# Patient Record
Sex: Female | Born: 1970 | Race: Black or African American | Hispanic: No | Marital: Married | Smoking: Never smoker
Health system: Southern US, Community
[De-identification: ages and names within clinical notes are randomized; demographics above are authoritative.]

## PROBLEM LIST (undated history)

## (undated) DIAGNOSIS — D62 Acute posthemorrhagic anemia: Secondary | ICD-10-CM

## (undated) DIAGNOSIS — Z8619 Personal history of other infectious and parasitic diseases: Secondary | ICD-10-CM

## (undated) DIAGNOSIS — Z8632 Personal history of gestational diabetes: Secondary | ICD-10-CM

## (undated) DIAGNOSIS — Z8759 Personal history of other complications of pregnancy, childbirth and the puerperium: Secondary | ICD-10-CM

## (undated) DIAGNOSIS — R51 Headache: Secondary | ICD-10-CM

## (undated) DIAGNOSIS — R519 Headache, unspecified: Secondary | ICD-10-CM

## (undated) HISTORY — DX: Personal history of gestational diabetes: Z86.32

## (undated) HISTORY — DX: Personal history of other infectious and parasitic diseases: Z86.19

## (undated) HISTORY — PX: WISDOM TOOTH EXTRACTION: SHX21

---

## 1999-05-24 ENCOUNTER — Other Ambulatory Visit: Admission: RE | Admit: 1999-05-24 | Discharge: 1999-05-24 | Payer: Self-pay | Admitting: Obstetrics and Gynecology

## 1999-12-02 ENCOUNTER — Inpatient Hospital Stay (HOSPITAL_COMMUNITY): Admission: AD | Admit: 1999-12-02 | Discharge: 1999-12-05 | Payer: Self-pay | Admitting: *Deleted

## 2000-01-04 ENCOUNTER — Encounter: Admission: RE | Admit: 2000-01-04 | Discharge: 2000-04-03 | Payer: Self-pay | Admitting: Obstetrics and Gynecology

## 2000-02-24 ENCOUNTER — Other Ambulatory Visit: Admission: RE | Admit: 2000-02-24 | Discharge: 2000-02-24 | Payer: Self-pay | Admitting: Obstetrics and Gynecology

## 2000-12-04 ENCOUNTER — Other Ambulatory Visit: Admission: RE | Admit: 2000-12-04 | Discharge: 2000-12-04 | Payer: Self-pay | Admitting: Obstetrics and Gynecology

## 2001-04-27 ENCOUNTER — Observation Stay (HOSPITAL_COMMUNITY): Admission: AD | Admit: 2001-04-27 | Discharge: 2001-04-28 | Payer: Self-pay | Admitting: Obstetrics and Gynecology

## 2001-04-28 ENCOUNTER — Encounter: Payer: Self-pay | Admitting: Obstetrics and Gynecology

## 2001-05-28 ENCOUNTER — Encounter: Payer: Self-pay | Admitting: Obstetrics and Gynecology

## 2001-05-28 ENCOUNTER — Ambulatory Visit (HOSPITAL_COMMUNITY): Admission: RE | Admit: 2001-05-28 | Discharge: 2001-05-28 | Payer: Self-pay | Admitting: Obstetrics and Gynecology

## 2001-06-08 ENCOUNTER — Encounter: Admission: RE | Admit: 2001-06-08 | Discharge: 2001-07-18 | Payer: Self-pay | Admitting: Obstetrics and Gynecology

## 2001-06-23 ENCOUNTER — Inpatient Hospital Stay (HOSPITAL_COMMUNITY): Admission: AD | Admit: 2001-06-23 | Discharge: 2001-06-26 | Payer: Self-pay | Admitting: Obstetrics and Gynecology

## 2001-06-27 ENCOUNTER — Encounter: Admission: RE | Admit: 2001-06-27 | Discharge: 2001-07-18 | Payer: Self-pay | Admitting: Obstetrics and Gynecology

## 2001-07-28 ENCOUNTER — Encounter: Admission: RE | Admit: 2001-07-28 | Discharge: 2001-08-27 | Payer: Self-pay | Admitting: Obstetrics and Gynecology

## 2001-08-28 ENCOUNTER — Encounter: Admission: RE | Admit: 2001-08-28 | Discharge: 2001-09-27 | Payer: Self-pay | Admitting: Obstetrics and Gynecology

## 2001-10-10 ENCOUNTER — Other Ambulatory Visit: Admission: RE | Admit: 2001-10-10 | Discharge: 2001-10-10 | Payer: Self-pay | Admitting: Obstetrics and Gynecology

## 2001-10-28 ENCOUNTER — Encounter: Admission: RE | Admit: 2001-10-28 | Discharge: 2001-11-27 | Payer: Self-pay | Admitting: Obstetrics and Gynecology

## 2001-12-28 ENCOUNTER — Encounter: Admission: RE | Admit: 2001-12-28 | Discharge: 2002-01-27 | Payer: Self-pay | Admitting: Obstetrics and Gynecology

## 2002-01-28 ENCOUNTER — Encounter: Admission: RE | Admit: 2002-01-28 | Discharge: 2002-02-27 | Payer: Self-pay | Admitting: Obstetrics and Gynecology

## 2002-10-10 ENCOUNTER — Other Ambulatory Visit: Admission: RE | Admit: 2002-10-10 | Discharge: 2002-10-10 | Payer: Self-pay | Admitting: Obstetrics and Gynecology

## 2003-10-27 ENCOUNTER — Other Ambulatory Visit: Admission: RE | Admit: 2003-10-27 | Discharge: 2003-10-27 | Payer: Self-pay | Admitting: Obstetrics and Gynecology

## 2006-06-08 ENCOUNTER — Other Ambulatory Visit: Admission: RE | Admit: 2006-06-08 | Discharge: 2006-06-08 | Payer: Self-pay | Admitting: Obstetrics and Gynecology

## 2006-11-17 ENCOUNTER — Emergency Department (HOSPITAL_COMMUNITY): Admission: EM | Admit: 2006-11-17 | Discharge: 2006-11-17 | Payer: Self-pay | Admitting: Emergency Medicine

## 2007-08-22 ENCOUNTER — Ambulatory Visit (HOSPITAL_COMMUNITY): Admission: RE | Admit: 2007-08-22 | Discharge: 2007-08-22 | Payer: Self-pay | Admitting: Obstetrics and Gynecology

## 2007-08-22 ENCOUNTER — Inpatient Hospital Stay (HOSPITAL_COMMUNITY): Admission: AD | Admit: 2007-08-22 | Discharge: 2007-08-28 | Payer: Self-pay | Admitting: Obstetrics and Gynecology

## 2007-08-25 ENCOUNTER — Encounter (INDEPENDENT_AMBULATORY_CARE_PROVIDER_SITE_OTHER): Payer: Self-pay | Admitting: Obstetrics and Gynecology

## 2007-09-01 ENCOUNTER — Inpatient Hospital Stay (HOSPITAL_COMMUNITY): Admission: AD | Admit: 2007-09-01 | Discharge: 2007-09-04 | Payer: Self-pay | Admitting: Obstetrics and Gynecology

## 2007-09-01 ENCOUNTER — Inpatient Hospital Stay (HOSPITAL_COMMUNITY): Admission: AD | Admit: 2007-09-01 | Discharge: 2007-09-01 | Payer: Self-pay | Admitting: Obstetrics and Gynecology

## 2011-05-03 NOTE — H&P (Signed)
Shirley Griffin               ACCOUNT NO.:  [id number]   MEDICAL RECORD NO.:  [id number]          PATIENT TYPE:  INP   LOCATION:  9374                          [facility]ILITY[facility]  [facility]   PHYSICIAN:  Shirley Griffin, M.D. DATE OF BIRTH:  [birth year]   DATE OF ADMISSION:  09/01/2007  DATE OF DISCHARGE:                              HISTORY & PHYSICAL   SUBJECTIVE:  Shirley Griffin is a 40 year old black female, status post C-  section delivery of twins on August 25, 2007.  Her end of pregnancy as  well as the post partum period were complicated by elevated blood  pressures and pre-eclampsia for which she received magnesium therapy, as  well as anemia requiring 4 units of packed red blood cells.  She was  discharged home on August 28, 2007, and was given Procardia 30 mg to  take daily.  During this past week she reports slight headache at home  that is somewhat relieved by Motrin but not completely.  She has had no  visual disturbance or right upper quadrant pain.  She has reported some  burning with urination.   OBSTETRICAL HISTORY:  1. She is a gravida 4, para 4-0-1-4.  2. She had an elective AB in 1996, with no complications.  3. She had a vaginal delivery in 2000 of a female infant at 26 weeks'      gestation weighing 8 pounds with no complications.  4. She also had a C-section in 2002, of a female infant at 49 weeks'      gestation weighing 9 pounds and 14 ounce for failure to progress.   PAST MEDICAL HISTORY:  1. Gestational diabetes with her last child.  2. History of abnormal Pap in 1992, with laser therapy.  3. History of Trichomonas in the past.  4. Childhood Varicella.   PAST SURGICAL HISTORY:  1. Elective AB in 1996.  2. C-section in 2002.   FAMILY HISTORY:  Remarkable for 2 grandmothers with hypertension, mother  with insulin-dependent diabetes.   GENETIC HISTORY:  Remarkable for mother with sickle cell trait and the  patient is a twin and has a niece with twins.   SOCIAL HISTORY:  The patient is married to [employer].  He was  involved and supportive.  She works as a Designer, industrial/product.  She does not report  a religious affiliation.  She denies any alcohol, tobacco, or illicit  drug use.   OBJECTIVE DATA:  VITAL SIGNS:  Blood pressure is 151/95, 155/91, other  vital signs are stable.  She is afebrile.  HEENT:  Grossly within normal limits.  CHEST:  Clear to auscultation.  HEART:  Regular rate and rhythm.  ABDOMEN:  Soft and appropriately tender.  Her C-section incision is  healing and well approximated, although the center has an approximately  0.5-cm area with raw edges showing but without drainage or erythema.  Fundus is firm.  Lochia is scant.  She has negative CVA tenderness.  EXTREMITIES:  Without edema.   ADMISSION LABORATORY:  Hemoglobin is 11, hematocrit 32.6, white blood  cell count 13.2, platelets 430,000.  SGOT is  elevated at 81, SGP is 46,  LDH is 519.  Uric acid is 4.6.  Clean catch urinalysis specific gravity  is 1.025, 100 of glucose, 15 of ketones, large blood, 100 of protein,  trace leukocyte esterase and 7-10 white blood cells.   ASSESSMENT:  1. Status post cesarean section x1 week.  2. Pre-eclampsia.   PLAN:  1. Admit to AICU for magnesium sulfate therapy.  2. Repeat laboratories in the morning.  3. Urine culture secondary to UTI signs and symptoms.  4. M.D. to follow.      Shirley Griffin, C.N.M.      Shirley Griffin, M.D.  Electronically Signed    KS/MEDQ  D:  09/01/2007  T:  09/02/2007  Job:  [id number]

## 2011-05-03 NOTE — H&P (Signed)
NAMESPENSER, CONG NO.:  [id number]   MEDICAL RECORD NO.:  [id number]          PATIENT TYPE:  MAT   LOCATION:  MATC                          [facility]  [facility]   PHYSICIAN:  Naima A. Dillard, M.D. DATE OF BIRTH:  [birth year]   DATE OF ADMISSION:  08/22/2007  DATE OF DISCHARGE:                              HISTORY & PHYSICAL   PRIORITY ADMISSION HISTORY AND PHYSICAL   This is a 40 year old, gravida 4, para 2-0-1-2, at 68 and 4/7th weeks,  who presents for [facility] evaluation.  She has had a headache for several  days.  She denies vision changes.  She does report edema.  Pregnancy has  been followed by Dr. Pennie Rushing and remarkable for:  1)  Twins;  2)  Previous C-section;  3)  Macrosomia;  4)  History of gestational  diabetes;  5)  History of polyhydramnios;  6)  History of abnormal Pap;  7)  Desires V-BAC and BTL;  8)  ALLERGIC TO PENICILLIN AND FLOXIN.   ALLERGIES:  1. PENICILLIN.  2. FLOXIN.   OBSTETRIC HISTORY:  1. Elective abortion in 1996 with no complications.  2. She had a vaginal delivery in 2000 of a female infant at [redacted] weeks      gestation weighing 8 pounds with no complications.  3. She had a C-section in 2002 of a female infant at [redacted] weeks gestation      weighing 9 pounds 14 ounces for failure to progress.   PAST MEDICAL HISTORY:  1. Gestational diabetes with her last child.  2. History of abnormal Pap in '92 with laser therapy.  3. History of Trichomonas in the past.  4. Childhood Varicella.   PAST SURGICAL HISTORY:  1. Elective abortion in '96.  2. C-section in 2002.   FAMILY HISTORY:  Remarkable for two grandmothers with hypertension,  mother with insulin, and mother and father, who smoke cigarettes.   GENETIC HISTORY:  Remarkable for mother with sickle cell trait, and  patient, who is a twin and a niece with twins.   SOCIAL HISTORY:  The patient is married to [employer], who is  involved and supportive.  She works as a Designer, industrial/product.  She  does not report  a religious affiliation.  She denies any alcohol, tobacco, or drug use.   PRENATAL LABORATORY DATA:  Unavailable, but sickle cell trait is  negative, and gonorrhea and Chlamydia were negative.   HISTORY OF CURRENT PREGNANCY:  Patient entered care at [redacted] weeks  gestation.  She had a first trimester screen that was normal x2.  She  had some nausea at 14 weeks, and she had some second trimester bleeding  at 15 weeks related to intercourse.  Treated for a BV at 15 weeks.  She  had an ultrasound at 18 weeks that was normal except for a marginal  insertion of cord.  She had a Glucola at 20 weeks that was normal, and  ultrasound at 27 weeks that was normal except for a polyhydramnios on  Twin A.  A 27-week Glucola was normal, and she was placed on partial  bedrest at 28 weeks for increased contractions, and no further notes are  available, but she did exhibit some hypertension in the office today.   OBJECTIVE DATA:  VITAL SIGNS:  Stable.  Blood pressure is 136 to 146/77  to 86.  There was one episode of  150/104 when the patient was supine.  HEENT:  Within normal limits.  Thyroid normal and not enlarged.  CHEST:  Clear to auscultation.  HEART:  Regular rate and rhythm.  ABDOMEN:  Gravid.  Fetal monitor shows a reactive fetal heart rate in  both twins with intermittent, mild contractions.  CERVIX EXAM:  Declined by the patient.  EXTREMITIES:  1+ edema with DTRs 2+ and no clonus.   LABORATORY DATA:  White blood cell count 9.5, platelets 300.  Chemistries normal with an AST of 31, ALT of 17, uric acid 5.7.  Urine  protein is negative.   ASSESSMENT:  1. Intrauterine pregnancy with twins at 50 and 4/7th weeks.  2. Pregnancy-induced hypertension (PIH) versus preeclampsia.   PLAN:  1. Admit to Antenatal per Dr. Normand Sloop.  2. Bedrest with bathroom privileges.  3. 24-hour urine.  4. Call for blood pressure greater than 160/105.  5. TED hose.  6. Tylenol p.r.n. for  headache.      Marie L. Williams, C.N.M.      Naima A. Normand Sloop, M.D.  Electronically Signed    MLW/MEDQ  D:  08/22/2007  T:  08/22/2007  Job:  [id number]

## 2011-05-03 NOTE — Discharge Summary (Signed)
Shirley Griffin, Shirley Griffin NO.:  [id number]   MEDICAL RECORD NO.:  [id number]          PATIENT TYPE:  INP   LOCATION:  9132                          [facility]   PHYSICIAN:  Janine Limbo, M.D.DATE OF BIRTH:  11/19/1971   DATE OF ADMISSION:  08/22/2007  DATE OF DISCHARGE:  08/28/2007                               DISCHARGE SUMMARY   ADMISSION DIAGNOSES:  1. Intrauterine twin pregnancy at 36-4/7 weeks.  2. Preeclampsia.  3. Previous cesarean section.  4. Desires vaginal birth after cesarean section and tubal.   DISCHARGE DIAGNOSES:  1. Intrauterine twin pregnancy at 37 weeks.  2. Preeclampsia.  3. Desired tubal sterilization.   PROCEDURE:  1. Repeat low transverse cesarean section.  2. Tubal sterilization.  3. Spinal anesthesia.  4. Blood transfusion x4 units packed red blood cells.   HOSPITAL COURSE:  Shirley Griffin is a 40 year old gravida 4, para 2-0-1-2  at 36-4/7 weeks who presented for preeclampsia, PIH evaluation on  August 22, 2007.  Her pressures have been in the 150s/90s-104.  Pregnancy had been remarkable for:  1. Twins.  2. Previous cesarean section.  3. History of macrosomia.  4. History of gestational diabetes.  5. History of polyhydramnios.  6. History of abnormal Pap.  7. Patient initially desiring VBAC.  8. Desired tubal sterilization.  9. Allergic to PENICILLIN and FLOXIN.   Fetal heart rates were reactive.  PIH labs were within normal limits.  She was admitted for observation.  She initially had protein negative on  a urine specimen.  A 24-hour urine was begun.  The patient was placed on  Aldomet 250 mg p.o. t.i.d. on the first day of hospitalization on  September 4.  A 24-hour urine sample was obtained, showing 448 mg of  protein in a 24-hour specimen.  Heart rates were reactive.  Fetal  positions are noted to be vertex/vertex.  Patient did elect to proceed  with cesarean section.  She was taken to the operating room on  August 25, 2007 where a repeat low transverse cesarean section was performed  with an inverted T incision on the uterus, and tubal sterilization was  accomplished under spinal anesthesia.  Twin A was a viable female by the  name of Shirley Griffin.  Apgars were 8 and 9.  Weight was 6 pounds, 6 ounces.  Twin B was a female by the name of Shirley Griffin.  Apgars were 8 and 9.  Weight  was 7 pounds, 6 ounces.  The infants were taken to the full term  nursery.  Mother was taken to [facility] for continuing magnesium sulfate  therapy.  The placenta was sent to pathology, and the tubal ligation was  also verified by segments of the tube sent to pathology.   By late in the afternoon by August 25, 2007, the patient's hemoglobin  was noted to be 6.4.  The patient was feeling lightheaded.  Her vital  signs were showing some orthostatic changes.  Her physical exam was  within normal limits.  She was transfused with 2 units of packed red  blood cells.  O2 sats  were normal.  Magnesium sulfate was continued.   By day #1 postop, the patient was continuing to feel the effects of her  anemia.  Hemoglobin at that time was still low at 8.  She did receive 2  additional units, and that did make her feel much better.  Patient was  also placed on Procardia 30 mg XL daily.  Blood pressures were in the  130s/70s-80s.  Other vital signs were stable.  Hemoglobin status post  the second transfusion was 8.4.  White blood cell count 21.9, and  platelet count was 211.  Patient remained afebrile.  Infants were doing  well.   By postop day #3, the patient was up ad lib.  She was having no  problems.  She has not had a bowel movement yet but did plan to use  Dulcolax.  Her physical exam was within normal limits.  Her orthostatic  status was stable.  Dr. Stefano Gaul saw the patient.  She was deemed to  receive full benefit of her hospital stay and was discharged home.   DISCHARGE INSTRUCTIONS:  Per [facility] handout.  PIH   precautions were also reviewed with the patient.   DISCHARGE MEDICATIONS:  1. Motrin 600 mg p.o. q.6h. p.r.n. pain.  2. Percocet 5/325 1-2 p.o. q.3-4h. p.r.n. pain.  3. Procardia 30 mg XL daily.   Discharge followup will occur with the [facility] nurse on Friday of  this week for a blood pressure check and then p.r.n. to follow that at  [facility] in six weeks.      Renaldo Reel Shirley Griffin, C.N.M.      Janine Limbo, M.D.  Electronically Signed    VLL/MEDQ  D:  08/28/2007  T:  08/28/2007  Job:  [id number]

## 2011-05-03 NOTE — Discharge Summary (Signed)
Shirley Griffin, Shirley Griffin               ACCOUNT NO.:  [id number]   MEDICAL RECORD NO.:  [id number]          PATIENT TYPE:  INP   LOCATION:  9306                          [facility]   PHYSICIAN:  Naima A. Dillard, M.D. DATE OF BIRTH:  1971-10-25   DATE OF ADMISSION:  09/01/2007  DATE OF DISCHARGE:  09/04/2007                               DISCHARGE SUMMARY   ADMISSION DIAGNOSES:  1. One week post cesarean section.  2. Preeclampsia.   DISCHARGE DIAGNOSES:  1. One week post cesarean section.  2. Preeclampsia.   HOSPITAL PROCEDURES:  Magnesium sulfate administration.   HOSPITAL COURSE:  Patient was admitted status post one week from  delivery by cesarean section of twins on August 25, 2007.  She had  some hypertension at the end of her pregnancy and was discharged home on  August 28, 2007, with Procardia.  She returned with elevated blood  pressures and abnormal liver function test and was admitted to icu for  magnesium sulfate administration.  AST was 8 and ALT was 46.  On  September 02, 2007, she was doing well but had some headaches, blood  pressures were 130's to 150's/70's to 90's, AST was 85 and ALT was 43.  On September 15, blood pressures were 140 to 160/78 to 90, AST was 101,  ALT was 47.  She had her magnesium sulfate turned off and she was  transferred to the floor.  On September 04, 2007, her blood pressures  were 120's to 140's/70's to 90's.  She had a mild headache, lungs were  clear, abdomen was soft, incision was healing.  She was deemed to have  received full benefit of her hospital stay and was discharged home.   DISCHARGE LABS:  Sodium 138, potassium 4.4, creatinine 0.56, AST 74, ALT  45, LDH 645, uric acid 4.8, hemoglobin 10.2, platelets 493,000.   DISCHARGE MEDICATIONS:  1. Labetalol 200 mg p.o. t.i.d.  2. Darvocet one p.o. q.4h. p.r.n. pain.  3. Ambien 10 mg p.o. q.h.s. p.r.n. insomnia.   DISCHARGE INSTRUCTIONS:  1. Activity as tolerated.  2. Monitor  for signs and symptoms of worsening preeclampsia.  3. Medication administration and self-care.   DISCHARGE FOLLOWUP:  She has an appointment on September 10, 2007, at  1:45 p.m. with Dr. Su Hilt for a blood pressure check and labs.   CONDITION AT DISCHARGE:  Good.      Marie L. Williams, C.N.M.      Naima A. Normand Sloop, M.D.  Electronically Signed    MLW/MEDQ  D:  09/04/2007  T:  09/04/2007  Job:  [id number]

## 2011-05-03 NOTE — Op Note (Signed)
NAMEKATERIA, CUTRONA NO.:  [id number]   MEDICAL RECORD NO.:  [id number]          PATIENT TYPE:  INP   LOCATION:  9373                          [facility]   PHYSICIAN:  Osborn Coho, M.D.   DATE OF BIRTH:  13-Feb-1971   DATE OF PROCEDURE:  08/25/2007  DATE OF DISCHARGE:                               OPERATIVE REPORT   PREOPERATIVE DIAGNOSIS:  1. 37 weeks.  2. Twins.  3. Pre-eclampsia.   POSTOPERATIVE DIAGNOSIS:  1. 37 weeks.  2. Twins.  3. Pre-eclampsia.   PROCEDURE:  Repeat low transverse C-section and bilateral tubal  ligation.   ANESTHESIA:  Spinal.   ATTENDING:  Osborn Coho, M.D.   ASSISTANT:  Maxie Better, M.D.   FLUIDS REPLACED:  3200 mL.   URINE OUTPUT:  500 mL.   ESTIMATED BLOOD LOSS:  1000 mL.   COMPLICATIONS:  None.   FINDINGS:  Live female infant with Apgars of 8 at 1 minute and 9 at 5  minutes, Kaleb live female, Apgars 8 at 1 minute, 9 at 5 minutes,  placenta to pathology.   DESCRIPTION OF PROCEDURE:  The patient is taken to the operating room  after risks, benefits and alternatives were reviewed with the patient.  The patient verbalized understanding and consent signed and witnessed.  The patient was given a spinal per anesthesia and prepped and draped in  the normal sterile fashion.  A Pfannenstiel skin incision was made at  the site of the prior scar and carried down to the underlying layer of  fascia with the scalpel and Bovie.  The fascia was excised bilaterally  in the midline with the Bovie and extended bilaterally with the Mayo  scissors.  Kocher clamps were placed on the inferior aspect of fascial  incision and the rectus muscle excised from the fascia.  The same was  done on the superior aspect of the fascial incision.  The muscle was  separated in midline with a hemostat and the peritoneum entered bluntly  and extended manually.  In the lower portion of the pelvis, the rectus  muscle was excised with the  Bovie in order to excise in the midline  where the scar tissue was connecting the rectus muscles.   The bladder blade was placed and the bladder flap created with the  Metzenbaum scissors.  The uterine incision was made with a scalpel and  extended bilaterally with the bandage scissors.  The right shoulder  presented first of baby A and it was difficult to either get the head or  the foot, therefore, the incision was made to do an inverted T incision  and the infant was delivered via breech extraction without difficulty.  The cord was clamped and cut and the infant handed to the waiting  pediatricians.  Clear fluid was noted.  The membranes of baby B was then  ruptured and delivered via breech extraction with a nuchal cord x1  noted.  The infant was delivered without difficulty and after the cord  was clamped and cut, was handed off to the waiting pediatricians.  The  placentae were  removed via fundal massage and manual extraction and sent  to pathology.   The uterus was cleared of all clots and debris.  The inverted T portion  of the incision was repaired with 0 Vicryl in a running fashion and a  second imbricating layer was performed.  The primary incision was then  repaired with 0 Vicryl in a running fashion and a second imbricating  layer was performed.  The inverted T incision portion extended  approximately 4-5 cm.  After repair of the primary uterine incision.  The serosa was repaired over the inverted T portion of the incision with  3-0 Vicryl in a running fashion.  The left fallopian tube and ovary were  identified and noted to be within normal limits and the fallopian tube  was grasped with the Babcock in the isthmic portion and ligated twice  with 0 plain ties.  The tube was excised and sent off to pathology.  The  remaining pedicles were cauterized with the Bovie.  The same was done on  the right and the right ovary and fallopian tubes appeared to be within  normal limits.   Both fallopian tubes were identified at their fimbriated  ends prior to ligation.   The intra-abdominal cavity was copiously irrigated and the peritoneum  was repaired with 2-0 chromic in a running fashion.  The uterine  incision was noted to be hemostatic prior to repair of the peritoneum.  The fascia was repaired with 0 Vicryl in a running fashion.  The  subcutaneous tissue was irrigated and made hemostatic with the Bovie.  2-  0 plain stitches were placed x3 on the subcutaneous tissue to  reapproximate this area.  The skin was reapproximated using 3-0 Monocryl  via a subcuticular stitch.  Sponge, lap and needle counts were correct.  A pressure dressing was applied.  The patient tolerated the procedure  well and was awaiting transfer to the recovery room in good condition.      Osborn Coho, M.D.  Electronically Signed     AR/MEDQ  D:  08/25/2007  T:  08/25/2007  Job:  [id number]

## 2011-05-06 NOTE — H&P (Signed)
[facility]  Patient:    Shirley Griffin                           MRN: [id number] Adm. Date:  [id number] Attending:  Shaune Spittle Dictator:   Mack Guise, C.N.M.                         History and Physical  HISTORY OF PRESENT ILLNESS:   Shirley Griffin is a 40 year old gravida 2, para 0-0-1-0 at 39-3/7 weeks, EDD December 06, 1999, who presents with contractions increasing n frequency and intensity; reports positive fetal movement, no bleeding, no fluid  leaking from the vagina.  Denies any headache, visual changes or epigastric pain. Pregnancy has been followed by the C.N.M. service at [facility] and is remarkable for: #1 - Family history of twins; #2 - history of laser surgery of the cervix; #3 - group B strep negative per patient.  PRENATAL LABORATORY DATA:     On May 27, 1999, hemoglobin and hematocrit 12.5 and 32.6, platelets 444,000; blood type O-positive, antibody screen negative; sickle cell trait negative; VDRL nonreactive; rubella immune; hepatitis B surface antigen negative; HIV declined; urine culture negative; Pap smear within normal limits; GC and Chlamydia negative.  On June 21, 1999, AFP/free beta hCG within normal range at 28 weeks.  On September 14, 1999, one-hour glucose challenge 85 and hemoglobin 10.5.  At 36 weeks, culture of the vaginal tract is negative for group B strep, per the patient.  OBSTETRICAL HISTORY:          Induced abortion in 1997 and present pregnancy.  MEDICAL HISTORY:              Laser surgery of the cervix; Pap smears have been  normal since that time.  Patient with history of eczema.  SURGICAL HISTORY:             Wisdom teeth in 1992.  FAMILY HISTORY:               Maternal grandmother -- chronic hypertension. Mother with varicose veins.  GENETIC HISTORY:              Patient is a fraternal twin; otherwise, there is o history of familial or genetic disorders or children that died in  infancy or that were born with birth defects.  MEDICATIONS:                  Prenatal vitamins.  ALLERGIES:                    No known drug allergies.  HABITS:                       Patient denies the use of tobacco, alcohol or illicit drugs.  SOCIAL HISTORY:               Shirley Griffin is a 40 year old African-American college-educated female.  The father of the baby is Shona Simpson.  He is a  Chartered certified accountant.  He is involved and supportive.  They are of the [facility] faith.  REVIEW OF SYSTEMS:            There are no signs or symptoms suggestive of focal or systemic disease and the patient is typical of one with a uterine pregnancy at erm in active labor.  PHYSICAL EXAMINATION:  VITAL SIGNS:  Stable.  Afebrile.  HEENT:                        Unremarkable.  LUNGS:                        Clear.  HEART:                        Regular rate and rhythm.  ABDOMEN:                      Gravid in its contour.  Uterus fundus is noted to  extend 39 cm above the level of the pubic symphysis.  Thayer Ohm maneuvers finds the infant to be in a longitudinal lie, cephalic presentation and the estimated fetal weight is 7-1/2 pounds.  Electronic fetal monitor finds the baseline of the fetal heart rate to be 130 to 140 with average long-term variability.  Reactivity is present with periodic changes.  Patient is contracting every two to three minutes, lasting 50 to 70 seconds, being of mild-to-moderate intensity.  PELVIC:                       Digital exam of the cervix finds it to be 3- to 4-cm dilated, 80% effaced with cephalic presenting part at a -1 station.  Membranes re intact.  ASSESSMENT:                   1. Intrauterine pregnancy at 39-3/7 weeks.                               2. Active labor.  PLAN:                         Admit to birthing suite per consult with Erie Noe P. Haygood, M.D.; routine C.N.M. orders; start saline lock and Stadol 2 mg IV per patient  request for IV pain medications.  Patient will be followed expectantly in anticipation of a spontaneous vaginal delivery; this has been discussed with the patient in detail in language she can understand and she has  indicated her agreement. DD:  12/03/99 TD:  12/03/99 Job: [id number]

## 2011-05-06 NOTE — H&P (Signed)
[facility] of [facility]  Patient:    Shirley Griffin, Shirley Griffin                          MRN: [id number] Adm. Date:  [id number] Attending:  Leonard Schwartz Dictator:   Vance Gather Duplantis, C.N.M.                         History and Physical  HISTORY OF PRESENT ILLNESS:   [name] is a 40 year old black female, gravida 3, para 1-0-1-1 at 39-5/7 weeks, who presents complaining of uterine contractions every three to five minutes since about 3 a.m. this morning.  She denies any leaking or vaginal bleeding.  She denies any nausea, vomiting, headaches or visual disturbances.  Her pregnancy has been followed at [facility] by the M.D. service and has been complicated by:  #1 - Gestational diabetes, diagnosed at 36 weeks; #2 - polyhydramnios; #3 - questionable LMP; #4 - history of laser surgery.  Patients group B strep is negative and the patient desires an epidural for labor.  OBSTETRICAL/GYNECOLOGICAL HISTORY:  She is a gravida 3, para 1-0-1-1 who had an elective Ab in 1997 and delivered a viable female infant vaginally who weighed 8 pounds at [redacted] weeks gestation following a 6-hour labor in December of 2000.  She had an epidural for that labor and was delivered by Miguel Dibble, CNM.  Other GYN history:  She had abnormal Pap in 1994 that was treated with laser surgery and her Paps have been normal since.  ALLERGIES:                    She has no known drug allergies.  GENERAL MEDICAL HISTORY:      She reports having the usual childhood diseases, occasional urinary tract infections and her only surgeries were for laser surgery of the cervix, wisdom teeth and elective Ab.  FAMILY HISTORY:               Significant for maternal grandmother with hypertension and mother with varicosities.  GENETIC HISTORY:              Negative.  SOCIAL HISTORY:               She is single, she is employed full-time and she is of the [employer].  She denies any illicit drug use,  alcohol or smoking with this pregnancy.  PRENATAL LABORATORY DATA:     Her blood type is O-positive.  Her antibody screen is negative.  Sickle cell trait is negative.  Syphilis is nonreactive. Rubella is immune.  Hepatitis B surface antigen is negative.  HIV is nonreactive.  GC and Chlamydia are both negative.  Pap was within normal limits.  Her one-hour glucola is 100 and her maternal serum alpha-fetoprotein was within normal range and her 36-week beta strep was negative.  PHYSICAL EXAMINATION:  VITAL SIGNS:                  Stable.  She is afebrile.  HEENT:                        Grossly within normal limits.  HEART:                        Regular rhythm and rate.  CHEST:  Clear.  BREASTS:                      Soft and nontender.  ABDOMEN:                      Gravid with uterine contractions every three to five minutes.  Her fetal heart rate is reactive and reassuring.  PELVIC:                       Her cervix is 5 to 6 cm, 100%, vertex -1 with bulging membranes.  EXTREMITIES:                  Within normal limits.  ASSESSMENT:                   1. Intrauterine pregnancy at term.                               2. Active labor.                               3. Negative group B streptococcus.                               4. Polyhydramnios.                               5. Gestational diabetes.                               6. Desires epidural for labor.  PLAN:                         Her plan is to admit to labor and delivery, to follow routine M.D. orders per Dr. Janine Limbo; an epidural is also okayed per Dr. Janine Limbo. DD:  06/23/01 TD:  06/23/01 Job: [id number]
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
## 2011-05-06 NOTE — H&P (Signed)
[facility] of [facility]  Patient:    JIMIA, GENTLES                          MRN: [id number] Adm. Date:  [id number] Attending:  Leonard Schwartz Dictator:   Vance Gather Duplantis, C.N.M.                         History and Physical  HISTORY OF PRESENT ILLNESS:  [name] is a 40 year old black female gravida 3 DD:  06/23/01 TD:  06/23/01 Job: [id number]

## 2011-05-06 NOTE — Discharge Summary (Signed)
[facility] of [facility]  Patient:    Shirley Griffin, Shirley Griffin                          MRN: [id number] Adm. Date:  [id number] Disc. Date: 06/26/01 Attending:  Leonard Schwartz Dictator:   Wynelle Bourgeois, C.N.M.                           Discharge Summary  ADMISSION DIAGNOSES:          1. Intrauterine pregnancy at term.                               2. Active labor.                               3. Negative group B strep.                               4. Polyhydramnios.                               5. Gestational diabetes.                               6. Desires epidural for labor.  DISCHARGE DIAGNOSES:          1. Status post cesarean delivery of a viable                                  female infant named Tyshawn weighing 9 pounds                                  14 ounces, Apgars 8 and 9.                               2. Breast-feeding.                               3. Macrosomia.                               4. Cephalopelvic disproportion.  PROCEDURE:                    1. Epidural anesthesia.                               2. Primary low transverse cesarean section.  HOSPITAL COURSE:              Patient is a 40 year old G3, para 1-0-1-1 at 46 5/7 weeks who presented in labor on June 23, 2001 at 5-6 cm and continued to labor after receiving an epidural for anesthesia.  She proceeded with adequate labor, had an IUPC placed to document adequacy of labor.  Pitocin was begun when labor was deemed to be less than  optimal.  Later that day cervical examination remained unchanged.  Fetal heart rate had climbed to 170-180 after Tylenol and fluids and the patient had a fever of 101.  The recommendation for cesarean section was made at that time due to failure to progress and fetal tachycardia and the patient and her spouse agreed to proceed.  She had a low transverse cesarean section with Dr. Pennie Rushing for a viable female infant weighing 9 pounds 14 ounces with Apgars 8 and  9 with no complications.  EBL was less than 750 cc.  On postoperative day #1 she was doing well.  The baby was breast-feeding without problems.  She was afebrile.  Hemoglobin was 8.9.  She proceeded to receive routine postoperative care.  On June 24, 2001 her maximum temperature was 100.6 and she had no further fever after that time.  On postoperative day #2 she remained afebrile.  Physical examination was within normal limits.  She was breast-feeding well.  Her dressing remained clean, dry, and intact and her lochia was small.  On postoperative day #3 her vital signs were stable.  Her maximum temperature was 97.9.  Lungs were clear. Heart rate was regular.  Breasts were soft and nontender and filling. Incision was clean and dry with moderate subcutaneous edema of the skin just above the incision.  Lochia was small and moderate.  Extremities were within normal limits.  She was deemed to have received the full benefit of her hospital stay and was discharged home.  DISCHARGE LABORATORIES:       WBC 16.7, hemoglobin 8.9, hematocrit 26.9, platelets 283.  RPR nonreactive.  DISCHARGE MEDICATIONS:        1. Ibuprofen 600 mg p.o. q.6h. p.r.n.                               2. Tylox one to two p.o. q.3-4h. p.r.n.                               3. Micronor one p.o. q.d.  DISCHARGE INSTRUCTIONS:       Per [facility] handout.  DISCHARGE FOLLOW-UP:          Six weeks at [facility] or p.r.n. as indicated.DD: 06/26/01 TD:  06/26/01 Job: [id number]

## 2011-05-06 NOTE — Op Note (Signed)
[facility] [place]  Patient:    Shirley Griffin, Shirley Griffin                          MRN: [id number] Proc. Date: 06/23/01 Adm. Date:  [id number] Attending:  Leonard Schwartz                           Operative Report  PREOPERATIVE DIAGNOSES:       1. Intrauterine pregnancy at term.                               2. Failure to progress in labor.                               3. Fetal tachycardia.                               4. Polyhydramnios.  POSTOPERATIVE DIAGNOSES:      1. Intrauterine pregnancy at term.                               2. Failure to progress in labor.                               3. Fetal tachycardia.                               4. Polyhydramnios.                               5. Fetal macrosomia.                               6. Cephalopelvic disproportion.  OPERATION:                    Primary low transverse cesarean section.  SURGEON:                      Vanessa P. Pennie Rushing, M.D.  FIRST ASSISTANT:              Wynelle Bourgeois, C.N.M.  ANESTHESIA:                   Epidural.  ESTIMATED BLOOD LOSS:         Less than 750 cc.  COMPLICATIONS:                None.  FINDINGS:                     The uterus, tubes and ovaries were normal for the gravid state.  The patient was delivered of a female infant whose name is Tyshawn weighing 9 lb 14 oz with Apgars of 8 and 9 at one and five minutes respectively.  DESCRIPTION OF PROCEDURE:     The patient was taken to the operating room after appropriate identification and placed on the operating table.  After achievement of surgical anesthesia with the labor epidural, she was  placed in the supine position with a left lateral tilt.  Her Foley catheter from labor area was in place.  The abdomen was prepped with multiple layers of Betadine and draped as a sterile field.  A transverse incision was made in the abdomen and the abdomen opened in layers.  The peritoneum was entered and the uterus incised  approximately 1 cm above the uterovesical fold.  The infant was delivered from the occiput transverse position with the aid of a  Mityvac vacuum extractor and, after having the nares and pharynx suctioned and the cord clamped and cut, was handed off to the awaiting pediatricians.  The appropriate cord blood was drawn and the placenta noted to have separated from the uterus, then removed with gentle traction.  The uterine incision was closed with a running interlocking suture of 0 Vicryl.  An imbricating suture of 0 Vicryl was placed to allow for adequate hemostasis.  The visceral peritoneum was closed with a figure-of-eight suture of 2-0 Vicryl.  Copious irrigation was carried out and the abdominal peritoneum closed with a running suture of 2-0 Vicryl.  The rectus muscles were reapproximated in the midline with a figure-of-eight suture of 2-0 Vicryl.  The rectus fascia was closed with a running suture of 0 Vicryl then reinforced on either side of midline with a figure-of-eight suture of 0 Vicryl.  The subcutaneous tissue was made hemostatic with Bovie cautery and irrigated.  Skin staples were applied to the skin incision.  A sterile dressing was applied and the patient taken from the operating room to the recovery room in satisfactory condition, having tolerated the procedure well with sponge and instrument counts correct.  The infant went to the full-term nursery. DD:  06/23/01 TD:  06/23/01 Job: [id number]

## 2011-09-29 LAB — CBC
HCT: 31.1 — ABNORMAL LOW
Hemoglobin: 10.2 — ABNORMAL LOW
Hemoglobin: 10.4 — ABNORMAL LOW
MCHC: 33.4
MCV: 85.5
Platelets: 484 — ABNORMAL HIGH
RBC: 3.58 — ABNORMAL LOW
RBC: 3.64 — ABNORMAL LOW
RDW: 19.9 — ABNORMAL HIGH
WBC: 10.6 — ABNORMAL HIGH

## 2011-09-29 LAB — DIFFERENTIAL
Basophils Relative: 0
Eosinophils Absolute: 0.2
Eosinophils Relative: 1
Lymphs Abs: 2.2
Monocytes Relative: 6
Neutrophils Relative %: 74

## 2011-09-29 LAB — COMPREHENSIVE METABOLIC PANEL
ALT: 45 — ABNORMAL HIGH
AST: 74 — ABNORMAL HIGH
Albumin: 2.5 — ABNORMAL LOW
Alkaline Phosphatase: 90
BUN: 5 — ABNORMAL LOW
CO2: 25
Calcium: 7.9 — ABNORMAL LOW
Calcium: 8.7
Creatinine, Ser: 0.48
GFR calc Af Amer: >60
Glucose, Bld: 79
Glucose, Bld: 93
Potassium: 4.4
Sodium: 138
Total Protein: 5.9 — ABNORMAL LOW
Total Protein: 6.4

## 2011-09-29 LAB — LACTATE DEHYDROGENASE: LDH: 679 — ABNORMAL HIGH

## 2011-09-29 LAB — URIC ACID: Uric Acid, Serum: 4.6

## 2011-09-29 LAB — MAGNESIUM
Magnesium: 2.1
Magnesium: 4.3 — ABNORMAL HIGH

## 2011-09-30 LAB — CBC
HCT: 19.9 — ABNORMAL LOW
HCT: 23.5 — ABNORMAL LOW
HCT: 29.8 — ABNORMAL LOW
HCT: 31.3 — ABNORMAL LOW
HCT: 31.7 — ABNORMAL LOW
Hemoglobin: 10.5 — ABNORMAL LOW
Hemoglobin: 10.5 — ABNORMAL LOW
Hemoglobin: 11 — ABNORMAL LOW
Hemoglobin: 6.4 — CL
Hemoglobin: 8 — ABNORMAL LOW
Hemoglobin: 8.4 — ABNORMAL LOW
Hemoglobin: 9.8 — ABNORMAL LOW
MCHC: 32.2
MCHC: 33
MCHC: 33.1
MCHC: 33.1
MCHC: 33.7
MCHC: 33.8
MCHC: 33.9
MCHC: 33.9
MCV: 78.9
MCV: 79.4
MCV: 80.6
MCV: 85
Platelets: 219
Platelets: 262
Platelets: 430 — ABNORMAL HIGH
RBC: 2.47 — ABNORMAL LOW
RBC: 2.93 — ABNORMAL LOW
RBC: 3.67 — ABNORMAL LOW
RBC: 3.78 — ABNORMAL LOW
RBC: 3.79 — ABNORMAL LOW
RBC: 3.99
RDW: 18.9 — ABNORMAL HIGH
RDW: 19 — ABNORMAL HIGH
RDW: 19.6 — ABNORMAL HIGH
RDW: 20 — ABNORMAL HIGH
RDW: 22.7 — ABNORMAL HIGH
RDW: 22.8 — ABNORMAL HIGH
RDW: 23.2 — ABNORMAL HIGH
WBC: 19.6 — ABNORMAL HIGH

## 2011-09-30 LAB — CREATININE CLEARANCE, URINE, 24 HOUR
Creatinine Clearance: 164 — ABNORMAL HIGH
Creatinine, 24H Ur: 1369
Creatinine: 0.58
Urine Total Volume-CRCL: 4075

## 2011-09-30 LAB — COMPREHENSIVE METABOLIC PANEL
ALT: 12
ALT: 12
ALT: 14
ALT: 17
ALT: 19
ALT: 43 — ABNORMAL HIGH
ALT: 46 — ABNORMAL HIGH
AST: 27
AST: 28
AST: 32
AST: 81 — ABNORMAL HIGH
Albumin: 1.4 — ABNORMAL LOW
Albumin: 2.6 — ABNORMAL LOW
Alkaline Phosphatase: 101
Alkaline Phosphatase: 107
Alkaline Phosphatase: 170 — ABNORMAL HIGH
Alkaline Phosphatase: 97
Alkaline Phosphatase: 99
BUN: 2 — ABNORMAL LOW
BUN: 3 — ABNORMAL LOW
BUN: 4 — ABNORMAL LOW
BUN: 7
CO2: 21
CO2: 22
CO2: 23
CO2: 24
Calcium: 7.8 — ABNORMAL LOW
Calcium: 8.1 — ABNORMAL LOW
Calcium: 9.2
Calcium: 9.2
Calcium: 9.2
Calcium: 9.5
Chloride: 104
Chloride: 109
Creatinine, Ser: 0.58
Creatinine, Ser: 0.59
Creatinine, Ser: 0.69
GFR calc Af Amer: >60
GFR calc Af Amer: >60
GFR calc Af Amer: >60
GFR calc Af Amer: >60
GFR calc non Af Amer: >60
GFR calc non Af Amer: >60
GFR calc non Af Amer: >60
Glucose, Bld: 118 — ABNORMAL HIGH
Glucose, Bld: 83
Glucose, Bld: 92
Glucose, Bld: 94
Potassium: 3.4 — ABNORMAL LOW
Potassium: 3.5
Potassium: 3.9
Potassium: 4.3
Sodium: 134 — ABNORMAL LOW
Sodium: 135
Sodium: 136
Sodium: 137
Sodium: 138
Total Bilirubin: 0.5
Total Bilirubin: 0.7
Total Protein: 4.1 — ABNORMAL LOW
Total Protein: 4.9 — ABNORMAL LOW
Total Protein: 5.9 — ABNORMAL LOW
Total Protein: 6.3
Total Protein: 6.5
Total Protein: 6.6

## 2011-09-30 LAB — URINALYSIS, ROUTINE W REFLEX MICROSCOPIC
Glucose, UA: 100 — AB
Hgb urine dipstick: NEGATIVE
Ketones, ur: 15 — AB
Nitrite: NEGATIVE
Protein, ur: NEGATIVE
Specific Gravity, Urine: 1.025
Specific Gravity, Urine: <1.005 — ABNORMAL LOW
Urobilinogen, UA: 0.2
pH: 6

## 2011-09-30 LAB — PROTEIN, URINE, 24 HOUR
Collection Interval-UPROT: 24
Urine Total Volume-UPROT: 4075

## 2011-09-30 LAB — CROSSMATCH: Antibody Screen: NEGATIVE

## 2011-09-30 LAB — URINE MICROSCOPIC-ADD ON

## 2011-09-30 LAB — MAGNESIUM: Magnesium: 4.2 — ABNORMAL HIGH

## 2011-09-30 LAB — URIC ACID
Uric Acid, Serum: 5.6
Uric Acid, Serum: 5.7
Uric Acid, Serum: 5.7
Uric Acid, Serum: 6

## 2011-09-30 LAB — DIFFERENTIAL
Basophils Relative: 0
Eosinophils Absolute: 0
Lymphs Abs: 1.4
Monocytes Absolute: 1.2 — ABNORMAL HIGH
Monocytes Relative: 6
Neutro Abs: 19.2 — ABNORMAL HIGH

## 2011-09-30 LAB — LACTATE DEHYDROGENASE
LDH: 159
LDH: 174
LDH: 217
LDH: 597 — ABNORMAL HIGH

## 2011-09-30 LAB — SAMPLE TO BLOOD BANK

## 2011-09-30 LAB — ABO/RH: ABO/RH(D): O POS

## 2012-08-01 ENCOUNTER — Ambulatory Visit (INDEPENDENT_AMBULATORY_CARE_PROVIDER_SITE_OTHER): Payer: Private Health Insurance - Indemnity | Admitting: Obstetrics and Gynecology

## 2012-08-01 ENCOUNTER — Encounter: Payer: Self-pay | Admitting: Obstetrics and Gynecology

## 2012-08-01 VITALS — BP 126/84 | Resp 16 | Ht 68.0 in | Wt 191.0 lb

## 2012-08-01 DIAGNOSIS — A499 Bacterial infection, unspecified: Secondary | ICD-10-CM

## 2012-08-01 DIAGNOSIS — IMO0002 Reserved for concepts with insufficient information to code with codable children: Secondary | ICD-10-CM

## 2012-08-01 DIAGNOSIS — B9689 Other specified bacterial agents as the cause of diseases classified elsewhere: Secondary | ICD-10-CM

## 2012-08-01 DIAGNOSIS — Z0000 Encounter for general adult medical examination without abnormal findings: Secondary | ICD-10-CM

## 2012-08-01 DIAGNOSIS — N949 Unspecified condition associated with female genital organs and menstrual cycle: Secondary | ICD-10-CM

## 2012-08-01 DIAGNOSIS — N898 Other specified noninflammatory disorders of vagina: Secondary | ICD-10-CM

## 2012-08-01 DIAGNOSIS — N760 Acute vaginitis: Secondary | ICD-10-CM

## 2012-08-01 DIAGNOSIS — T192XXA Foreign body in vulva and vagina, initial encounter: Secondary | ICD-10-CM | POA: Insufficient documentation

## 2012-08-01 LAB — POCT WET PREP (WET MOUNT)
Clue Cells Wet Prep Whiff POC: POSITIVE
Trichomonas Wet Prep HPF POC: NEGATIVE
WBC, Wet Prep HPF POC: NEGATIVE

## 2012-08-01 MED ORDER — METRONIDAZOLE 500 MG PO TABS: 500.0000 mg | ORAL_TABLET | Freq: Two times a day (BID) | ORAL | Status: DC

## 2012-08-01 NOTE — Progress Notes (Signed)
C/o d/c with odor x one month with stomach pain per pt, worsened after cycle .Subjective: Patient reports having increasing vaginal odor and abdominal cramps.     Objective: I have reviewed patient's vital signs and and history of this event..  Affect: alert  And oriented x 3 Lungs: CTAB CV: RRR GI: Normal  Abdomen: soft and NT all 4 quadrants GU: change in vaginal secretions and odor has increased. External genitalia: normal with no swelling or inflammation. Speculum examination: Odor, pungent, Cx pink and perfused,Vaginal walls; normal. Wet prep: + Whiff. Ph 4.5, Clue cells, Oosum for BV - positive, Oosum for Trichomonas - neg Cultures: GC/ Chlamydia to lab Patient declined serum STD screened - has been offered a full STD screen. Bimanual examination: Tampon found in the posterior fornix (Patient stated that it has been possibly there of over a week) Tampon removed. Advised patient that this was the main source of her problem. Extremities: normal   Assessment/Plan: Vaginal odor associated with retained tampon and  Superimposed BV Tampon removed. BV - Tx with Flagyl 500mg s po BID x 7 days. Cultures GC/ Chlamydia to lab. F/u PRN  Earl Gala, CNM.   Yuriy Cui 08/01/2012, 5:33 PM

## 2012-08-02 ENCOUNTER — Other Ambulatory Visit: Payer: Self-pay

## 2012-08-02 ENCOUNTER — Other Ambulatory Visit: Payer: Self-pay | Admitting: Obstetrics and Gynecology

## 2012-08-02 LAB — GC/CHLAMYDIA PROBE AMP, GENITAL: Chlamydia, DNA Probe: NEGATIVE

## 2012-08-02 NOTE — Telephone Encounter (Signed)
Try calling pt rgd msg no answer unable to leave msg 

## 2012-08-02 NOTE — Telephone Encounter (Signed)
TRIED TO CALL PT BACK. COULD NOT GET AN ANSWER.

## 2012-08-02 NOTE — Telephone Encounter (Signed)
TRIAGE/FOLLOW UP °

## 2012-08-03 ENCOUNTER — Telehealth: Payer: Self-pay | Admitting: Obstetrics and Gynecology

## 2012-08-03 DIAGNOSIS — N760 Acute vaginitis: Secondary | ICD-10-CM

## 2012-08-03 NOTE — Telephone Encounter (Signed)
Tc to pt per telephone call. Lm on vm to cb. 

## 2012-08-03 NOTE — Telephone Encounter (Signed)
TRIAGE/RX °

## 2012-08-06 ENCOUNTER — Telehealth: Payer: Self-pay | Admitting: Obstetrics and Gynecology

## 2012-08-06 MED ORDER — METRONIDAZOLE 500 MG PO TABS: 500.0000 mg | ORAL_TABLET | Freq: Two times a day (BID) | ORAL | Status: AC

## 2012-08-06 NOTE — Telephone Encounter (Signed)
Tc from pt per telephone call. Pt states,"never received rx for ATB's from 08/01/12". Rx for Flagyl on file e-pres to pharm on file. Rx not received before at [facility] due to rx being printed in error. Pt agrees.

## 2012-08-06 NOTE — Telephone Encounter (Signed)
Tc to pt per telephone call. No answer.

## 2012-09-11 ENCOUNTER — Telehealth: Payer: Self-pay | Admitting: Obstetrics and Gynecology

## 2012-09-11 NOTE — Telephone Encounter (Signed)
TC to pt.  States after taking Rx 07/2012 had menses. After menses noticed vag D/C with itching. Used OTC 3-day Monistat last week with some improvement, but still having vag itching/irritation.  Requesting appt. 09/12/12.  Scheduled with EP.

## 2012-09-12 ENCOUNTER — Encounter: Payer: Self-pay | Admitting: Obstetrics and Gynecology

## 2012-09-12 ENCOUNTER — Ambulatory Visit (INDEPENDENT_AMBULATORY_CARE_PROVIDER_SITE_OTHER): Payer: Private Health Insurance - Indemnity | Admitting: Obstetrics and Gynecology

## 2012-09-12 VITALS — BP 112/72 | HR 80 | Wt 190.0 lb

## 2012-09-12 DIAGNOSIS — N898 Other specified noninflammatory disorders of vagina: Secondary | ICD-10-CM

## 2012-09-12 DIAGNOSIS — Z113 Encounter for screening for infections with a predominantly sexual mode of transmission: Secondary | ICD-10-CM

## 2012-09-12 DIAGNOSIS — L293 Anogenital pruritus, unspecified: Secondary | ICD-10-CM

## 2012-09-12 LAB — POCT WET PREP (WET MOUNT)
Whiff Test: NEGATIVE
pH: 5.5

## 2012-09-12 LAB — RPR

## 2012-09-12 MED ORDER — CLOTRIMAZOLE-BETAMETHASONE 1-0.05 % EX CREA: TOPICAL_CREAM | CUTANEOUS | Status: DC

## 2012-09-12 MED ORDER — FLUCONAZOLE 150 MG PO TABS: 150.0000 mg | ORAL_TABLET | Freq: Once | ORAL | Status: AC

## 2012-09-12 NOTE — Patient Instructions (Signed)
Avoid: - excess soap on genital area (consider using plain oatmeal soap) - use of powder or sprays in genital area - douching - wearing underwear to bed (except with menses) - using more than is directed detergent when washing clothes - tight fitting garments around genital area - excess sugar intake   

## 2012-09-12 NOTE — Progress Notes (Signed)
41 YO with retained tampon in August and was treated for BV but now has burning and intense  itching.  (period just started).  O: Pelvic: EGBUS-wnl (blood stained), vagina-moderate blood, cervix-no lesions, uterus/adnexae-normal  Wet Prep-obscured by blood  A: Pruritic Vulvovaginitis  P: Diflucan 150 mg #1 1 po stat 1 refill      Lotrisone Cream #15 grams apply to affected area bid x 7-14 days      Perineal hygiene      RTO-as scheduled or prn  Kairee Kozma, PA-C

## 2012-09-12 NOTE — Progress Notes (Signed)
Color: none Odor: no Itching:yes Thin:no Thick:no Fever:no Dyspareunia:no Hx PID:no HX STD:no Pelvic Pain:no Desires Gc/CT:no Desires HIV,RPR,HbsAG:yes

## 2012-09-13 LAB — HSV 2 ANTIBODY, IGG: HSV 2 Glycoprotein G Ab, IgG: 0.2 IV

## 2014-10-20 ENCOUNTER — Encounter: Payer: Self-pay | Admitting: Obstetrics and Gynecology

## 2016-11-28 ENCOUNTER — Emergency Department (HOSPITAL_COMMUNITY)
Admission: EM | Admit: 2016-11-28 | Discharge: 2016-11-29 | Disposition: A | Discharge status: Home / Self Care | Payer: 59 | Attending: Emergency Medicine | Admitting: Emergency Medicine

## 2016-11-28 ENCOUNTER — Encounter (HOSPITAL_COMMUNITY): Payer: Self-pay | Admitting: Emergency Medicine

## 2016-11-28 DIAGNOSIS — R51 Headache: Secondary | ICD-10-CM | POA: Diagnosis present

## 2016-11-28 DIAGNOSIS — Z79899 Other long term (current) drug therapy: Secondary | ICD-10-CM | POA: Insufficient documentation

## 2016-11-28 DIAGNOSIS — D649 Anemia, unspecified: Secondary | ICD-10-CM | POA: Insufficient documentation

## 2016-11-28 LAB — CBC
HEMATOCRIT: 21.9 % — AB (ref 36.0–46.0)
HEMOGLOBIN: 6.1 g/dL — AB (ref 12.0–15.0)
MCH: 17 pg — ABNORMAL LOW (ref 26.0–34.0)
MCHC: 27.9 g/dL — AB (ref 30.0–36.0)
MCV: 61.2 fL — ABNORMAL LOW (ref 78.0–100.0)
Platelets: 606 10*3/uL — ABNORMAL HIGH (ref 150–400)
RBC: 3.58 MIL/uL — ABNORMAL LOW (ref 3.87–5.11)
RDW: 21.4 % — ABNORMAL HIGH (ref 11.5–15.5)
WBC: 9.3 10*3/uL (ref 4.0–10.5)

## 2016-11-28 LAB — I-STAT CHEM 8, ED
BUN: 6 mg/dL (ref 6–20)
CREATININE: 0.8 mg/dL (ref 0.44–1.00)
Calcium, Ion: 1.23 mmol/L (ref 1.15–1.40)
Chloride: 103 mmol/L (ref 101–111)
Glucose, Bld: 93 mg/dL (ref 65–99)
HEMATOCRIT: 24 % — AB (ref 36.0–46.0)
Hemoglobin: 8.2 g/dL — ABNORMAL LOW (ref 12.0–15.0)
POTASSIUM: 3.4 mmol/L — AB (ref 3.5–5.1)
Sodium: 140 mmol/L (ref 135–145)
TCO2: 24 mmol/L (ref 0–100)

## 2016-11-28 LAB — PREPARE RBC (CROSSMATCH)

## 2016-11-28 LAB — ABO/RH: ABO/RH(D): O POS

## 2016-11-28 LAB — POC OCCULT BLOOD, ED: FECAL OCCULT BLD: NEGATIVE

## 2016-11-28 MED ORDER — ACETAMINOPHEN 325 MG PO TABS
650.0000 mg | ORAL_TABLET | Freq: Once | ORAL | Status: AC
  Administered 2016-11-28: 650 mg via ORAL
  Filled 2016-11-28: qty 2

## 2016-11-28 MED ORDER — SODIUM CHLORIDE 0.9 % IV SOLN: 10.0000 mL/h | Freq: Once | INTRAVENOUS | Status: DC

## 2016-11-28 NOTE — ED Provider Notes (Signed)
[facility] DEPT Provider Note   CSN: [id number] Arrival date & time: 11/28/16  1820  By signing my name below, I, [name], attest that this documentation has been prepared under the direction and in the presence [employer], PA-C. Electronically Signed: [name], Scribe. 11/28/2016. 10:24 PM.  History   Chief Complaint Chief Complaint  Patient presents with  . Abnormal Lab  . Headache    The history is provided by the patient. No language interpreter was used.     HPI Comments: Shirley Griffin is a 45 y.o. female who presents to the Emergency Department complaining of constant, worsening, fatigue and generalized weakness for the last two months. Pt reports she has become short of breath with exertion for the last couple of months as well. She reports intermittent headaches for the last two weeks that are most significant temporally and behind her eyes. She has tried Tylenol for her headaches with mild relief. She states her menstrual cycles have been unusually heavy for the last couple of months; she notes her usual cycle lasts for 5 days and she bleeds heavily for 2 days and has to change her tampon every hour. She had her annual check-up with her OB/GYN this past September. Pt went to her PCP earlier today for the same reasons and was advised to come to the ER after her hemoglobin was measured at 6. She reports a h/o post-operative (C-section) blood transfusion but denies other h/o blood transfusion. She reports constipation at baseline and is not on iron supplements. She reports a FMHx diabetes (mother) and is concerned for the same. Pt denies melena, hematochezia, nausea, vomiting, fever, chills, or any other associated symptoms.   Past Medical History:  Diagnosis Date  . H/O migraine   . H/O varicella   . History of bacterial infection   . History of gestational diabetes mellitus, not pregnant   . Pregnancy induced hypertension   . Trichomonas   . Yeast infection       Patient Active Problem List   Diagnosis Date Noted  . Retained tampon 08/01/2012    Past Surgical History:  Procedure Laterality Date  . CESAREAN SECTION    . TUBAL LIGATION    . WISDOM TOOTH EXTRACTION      OB History    Gravida Para Term Preterm AB Living   4 4     0 4   SAB TAB Ectopic Multiple Live Births                   Home Medications    Prior to Admission medications   Medication Sig Start Date End Date Taking? Authorizing Provider  clobetasol cream (TEMOVATE) 0.05 % Apply topically 2 (two) times daily.   Yes Historical Provider, MD  clotrimazole-betamethasone (LOTRISONE) cream Apply to affected areas bid x 7-14 days Patient not taking: Reported on 11/28/2016 09/12/12   Earnstine Regal, PA-C  ferrous sulfate 325 (65 FE) MG tablet Take 1 tablet (325 mg total) by mouth daily. 11/29/16   Antonietta Breach, PA-C    Family History Family History  Problem Relation Age of Onset  . Cancer Paternal Grandfather   . Hypertension Paternal Grandfather   . Hypertension Paternal Grandmother   . Hypertension Maternal Grandmother   . Heart attack Maternal Grandfather   . Hypertension Father   . Diabetes Mother     Social History Social History  Substance Use Topics  . Smoking status: Never Smoker  . Smokeless tobacco: Never Used  . Alcohol  use Yes     Allergies   Floxin [ofloxacin]   Review of Systems Review of Systems A complete 10 system review of systems was obtained and all systems are negative except as noted in the HPI and PMH.    Physical Exam Updated Vital Signs BP 147/89   Pulse 78   Temp 98.3 F (36.8 C) (Oral)   Resp 17   Ht 5\' 7"  (1.702 m)   Wt 84.4 kg   LMP 11/14/2016   SpO2 100%   BMI 29.13 kg/m   Physical Exam  Constitutional: She is oriented to person, place, and time. She appears well-developed and well-nourished. No distress.  Nontoxic-appearing  HENT:  Head: Normocephalic and atraumatic.  Eyes: Conjunctivae and EOM are normal.  No scleral icterus.  Neck: Normal range of motion.  Cardiovascular: Normal rate, regular rhythm and intact distal pulses.   Pulmonary/Chest: Effort normal. No respiratory distress. She has no wheezes.  Respirations even and unlabored  Genitourinary:  Genitourinary Comments: Brown stool on DRE. No melena or hematochezia. Normal rectal tone.  Musculoskeletal: Normal range of motion.  Neurological: She is alert and oriented to person, place, and time. She exhibits normal muscle tone. Coordination normal.  GCS 15. Patient moving all extremities.  Skin: Skin is warm and dry. No rash noted. She is not diaphoretic. No erythema.  Psychiatric: She has a normal mood and affect. Her behavior is normal.  Nursing note and vitals reviewed.    ED Treatments / Results  Labs (all labs ordered are listed, but only abnormal results are displayed) Labs Reviewed  CBC - Abnormal; Notable for the following:       Result Value   RBC 3.58 (*)    Hemoglobin 6.1 (*)    HCT 21.9 (*)    MCV 61.2 (*)    MCH 17.0 (*)    MCHC 27.9 (*)    RDW 21.4 (*)    Platelets 606 (*)    All other components within normal limits  I-STAT CHEM 8, ED - Abnormal; Notable for the following:    Potassium 3.4 (*)    Hemoglobin 8.2 (*)    HCT 24.0 (*)    All other components within normal limits  POC OCCULT BLOOD, ED  TYPE AND SCREEN  ABO/RH  PREPARE RBC (CROSSMATCH)    EKG  EKG Interpretation None       Radiology No results found.  Procedures Procedures (including critical care time)  DIAGNOSTIC STUDIES: Oxygen Saturation is 100% on RA, normal by my interpretation.    COORDINATION OF CARE: 10:31 PM  Discussed treatment plan with pt at bedside and pt agreed to plan.  5:50 AM  Patient reassessed post transfusion. She states that she ambulated to the bathroom and did not feel short of breath. Lung sounds are clear bilaterally. She has no hypoxia. She reports feeling better. Anticipate discharge at 6  AM.  Medications Ordered in ED Medications  0.9 %  sodium chloride infusion (not administered)  acetaminophen (TYLENOL) tablet 650 mg (650 mg Oral Given 11/28/16 2244)    CRITICAL CARE Performed by: Antonietta Breach   Total critical care time: 45 minutes  Critical care time was exclusive of separately billable procedures and treating other patients.  Critical care was necessary to treat or prevent imminent or life-threatening deterioration.  Critical care was time spent personally by me on the following activities: development of treatment plan with patient and/or surrogate as well as nursing, discussions with consultants, evaluation of patient's response  to treatment, examination of patient, obtaining history from patient or surrogate, ordering and performing treatments and interventions, ordering and review of laboratory studies, ordering and review of radiographic studies, pulse oximetry and re-evaluation of patient's condition.    Initial Impression / Assessment and Plan / ED Course  I have reviewed the triage vital signs and the nursing notes.  Pertinent labs & imaging results that were available during my care of the patient were reviewed by me and considered in my medical decision making (see chart for details).  Clinical Course     45 year old female presents to the emergency department for a few months of progressive fatigue and intermittent frontal headaches with mild dyspnea on exertion. She denies any lightheadedness, melena, or hematochezia. She does report heavier menstrual cycles over the past few months. Patient hemodynamically stable today with reassuring orthostatic vital signs. Her hemoglobin was found to be 6.1, down from her baseline of ~10.5. Patient Hemoccult negative in the emergency department. The decision was made to transfuse the patient for symptomatic relief.  The patient has been monitored in the emergency department following transfusion of 2 units PRBCs.  The patient states that she feels much better compared to arrival. Vitals have remained stable. No fever. On repeat exam, lungs are clear to auscultation. Patient has no hypoxia. Plan to refer to hematology/oncology for further outpatient workup. The patient will be started on iron tablets and I have advised that she see her primary care doctor in 1 week for repeat CBC. Return precautions discussed and provided. Patient discharged in satisfactory condition with no unaddressed concerns.   Final Clinical Impressions(s) / ED Diagnoses   Final diagnoses:  Symptomatic anemia    New Prescriptions New Prescriptions   FERROUS SULFATE 325 (65 FE) MG TABLET    Take 1 tablet (325 mg total) by mouth daily.    I personally performed the services described in this documentation, which was scribed in my presence. The recorded information has been reviewed and is accurate.      Antonietta Breach, PA-C 11/29/16 [id number]    Sherwood Gambler, MD 11/29/16 [phone]

## 2016-11-28 NOTE — ED Triage Notes (Addendum)
Patient reports she was sent by PCP to be evaluated for low iron levels. Patient states per PCP her Hemoglobin is 6. Patient c/o headache x2 weeks. Denies dizziness. Ambulatory to triage.

## 2016-11-29 DIAGNOSIS — D649 Anemia, unspecified: Secondary | ICD-10-CM | POA: Diagnosis not present

## 2016-11-29 MED ORDER — FERROUS SULFATE 325 (65 FE) MG PO TABS: 325.0000 mg | ORAL_TABLET | Freq: Every day | ORAL | 1 refills | Status: DC

## 2016-11-29 NOTE — Discharge Instructions (Signed)
You have been transfused with 2 units of blood today for management of your anemia. Take iron tablets as prescribed. Your hemoglobin was 6.1 during your ED visit, prior to your transfusion. We advise that you follow-up with a hematologist to further evaluate the cause of your anemia today. We believe that your symptoms may be due to heavier menstrual cycles over the last few months. Discuss the possibility of this with your OBGYN. We also advised primary care follow-up to recheck your CBC in 1 week. Return to the emergency department for new or worsening symptoms.

## 2016-11-30 LAB — TYPE AND SCREEN
Blood Product Expiration Date: 201712272359
Blood Product Expiration Date: 201712272359
ISSUE DATE / TIME: 201712120011
ISSUE DATE / TIME: 201712120238
UNIT TYPE AND RH: 5100
Unit Type and Rh: 5100

## 2016-12-07 ENCOUNTER — Other Ambulatory Visit: Payer: Self-pay | Admitting: Obstetrics and Gynecology

## 2016-12-23 DIAGNOSIS — D649 Anemia, unspecified: Secondary | ICD-10-CM | POA: Diagnosis not present

## 2016-12-27 ENCOUNTER — Other Ambulatory Visit: Payer: Self-pay | Admitting: Obstetrics and Gynecology

## 2016-12-27 ENCOUNTER — Encounter (HOSPITAL_BASED_OUTPATIENT_CLINIC_OR_DEPARTMENT_OTHER): Payer: Self-pay | Admitting: *Deleted

## 2016-12-27 DIAGNOSIS — D500 Iron deficiency anemia secondary to blood loss (chronic): Secondary | ICD-10-CM | POA: Diagnosis present

## 2016-12-27 DIAGNOSIS — N920 Excessive and frequent menstruation with regular cycle: Secondary | ICD-10-CM | POA: Diagnosis present

## 2016-12-27 DIAGNOSIS — D251 Intramural leiomyoma of uterus: Secondary | ICD-10-CM | POA: Diagnosis present

## 2016-12-27 NOTE — H&P (Signed)
Shirley Griffin is an 46 y.o. female   Who presents for endometrial ablation for menorrhagia with anemia.  Pertinent Gynecological History: Menses: flow is excessive with use of 12 pads or tampons on heaviest days Bleeding: intermenstrual bleeding Contraception: tubal ligation DES exposure: unknown Blood transfusions:  yes Sexually transmitted diseases:  Trichomonas Previous GYN Procedures: Tubal ligation  Last mammogram: normal Date: October 2017 Last pap: normal Date: 2017 OB History: G2, P2   Menstrual History: Menarche age14 Patient's last menstrual period was 11/14/2016,  But she has been bleeding off and on since 12/15/2016.  She started estradiol 2 days ago and has stopped bleeding    Past Medical History:  Diagnosis Date  . Anemia associated with acute blood loss    11-28-2016 symptomatic  HG 6.2  transfused x1 11-29-2016  . H/O varicella   . Headache   . History of bacterial infection   . History of gestational diabetes mellitus, not pregnant   . History of gestational hypertension   . History of trichomoniasis     Past Surgical History:  Procedure Laterality Date  . CESAREAN SECTION  06/23/2001  . CESAREAN SECTION W/BTL  08/25/2007  . WISDOM TOOTH EXTRACTION      Family History  Problem Relation Age of Onset  . Cancer Paternal Grandfather   . Hypertension Paternal Grandfather   . Hypertension Paternal Grandmother   . Hypertension Maternal Grandmother   . Heart attack Maternal Grandfather   . Hypertension Father   . Diabetes Mother     Social History:  reports that she has never smoked. She has never used smokeless tobacco. She reports that she drinks alcohol. She reports that she does not use drugs.  Allergies:  Allergies  Allergen Reactions  . Floxin [Ofloxacin] Hives and Itching    Prescriptions Prior to Admission  Medication Sig Dispense Refill Last Dose  . clobetasol cream (TEMOVATE) [id number] % Apply 1 application topically daily as needed (eczema).     12/29/2016 at Unknown time  . estradiol (ESTRACE) 2 MG tablet Take 8 mg by mouth. Take 4 mg twice daily for 1 week, then take 6mg s daily for 1 week, then 4mg s daily for 1 week, then 2mg s daily for 1 week   12/29/2016 at Unknown time  . EUCRISA 2 % OINT Apply 1 application topically daily as needed (eczema).   0 12/29/2016 at Unknown time  . ferrous sulfate 325 (65 FE) MG tablet Take 1 tablet (325 mg total) by mouth daily. (Patient taking differently: Take 325 mg by mouth 2 (two) times daily. ) 30 tablet 1 12/29/2016 at Unknown time    ROS  Blood pressure 125/86, pulse 83, temperature 98.5 F (36.9 C), temperature source Oral, resp. rate 16, height 5\' 7"  (1.702 m), weight 187 lb (84.8 kg), last menstrual period 12/15/2016, SpO2 100 %. Physical Exam  Constitutional: She is oriented to person, place, and time. She appears well-developed and well-nourished.  HENT:  Head: Normocephalic and atraumatic.  Eyes: EOM are normal.  Conjunctiva pale  Neck: Normal range of motion. Neck supple.  Cardiovascular: Normal rate and regular rhythm.   Respiratory: Effort normal and breath sounds normal.  GI: Soft. Bowel sounds are normal.  Genitourinary:  Genitourinary Comments: Pelvic exam:  VULVA: normal appearing vulva with no masses, tenderness or lesions,  VAGINA: normal appearing vagina with normal color and discharge, no lesions,  CERVIX: normal appearing cervix without discharge or lesions,  UTERUS: enlarged to 8 week's size. Pelvic exam: normal external genitalia, vulva, vagina,  cervix, uterus and adnexa,  ADNEXA: normal adnexa in size, nontender and no masses,  RECTAL: normal rectal, no masses, rectovaginal exam confirms pelvic findings.  Musculoskeletal: Normal range of motion.  Neurological: She is alert and oriented to person, place, and time.  Skin: Skin is warm and dry.  Psychiatric: She has a normal mood and affect.   Office ultrasound:intramural and subserosal fibroids. The largest  measures 2.4 cm. Endometrium appears within normal limits.  Endometrial biopsy: Normal  Results for orders placed or performed during the hospital encounter of 12/30/16 (from the past 24 hour(s))  Basic metabolic panel     Status: Abnormal   Collection Time: 12/30/16  7:31 AM  Result Value Ref Range   Sodium 136 135 - 145 mmol/L   Potassium 3.5 3.5 - 5.1 mmol/L   Chloride 106 101 - 111 mmol/L   CO2 24 22 - 32 mmol/L   Glucose, Bld 107 (H) 65 - 99 mg/dL   BUN 7 6 - 20 mg/dL   Creatinine, Ser 0.77 0.44 - 1.00 mg/dL   Calcium 9.1 8.9 - 10.3 mg/dL   GFR calc non Af Amer >60 >60 mL/min   GFR calc Af Amer >60 >60 mL/min   Anion gap 6 5 - 15  CBC     Status: Abnormal   Collection Time: 12/30/16  7:31 AM  Result Value Ref Range   WBC 8.8 4.0 - 10.5 K/uL   RBC 4.45 3.87 - 5.11 MIL/uL   Hemoglobin 9.5 (L) 12.0 - 15.0 g/dL   HCT 31.2 (L) 36.0 - 46.0 %   MCV 70.1 (L) 78.0 - 100.0 fL   MCH 21.3 (L) 26.0 - 34.0 pg   MCHC 30.4 30.0 - 36.0 g/dL   RDW 30.0 (H) 11.5 - 15.5 %   Platelets 517 (H) 150 - 400 K/uL  Pregnancy, urine     Status: None   Collection Time: 12/30/16  7:31 AM  Result Value Ref Range   Preg Test, Ur NEGATIVE NEGATIVE     Assessment Menorrhagia Uterine fibroids Anemia with negative colonoscopy    Recommendations Several discussions were held with the patient concerning options for management of menorrhagia with anemia.  She wants to proceed with endometrial ablation. The indications, risks and benefits have been reviewed with the patient. She understands that approximately 85% of women will have improvement in their vaginal bleeding after endometrial ablation. She expressed understanding of the risks of anesthesia, bleeding, infection, damage to adjacent organs, and the specific risk of uterine perforation which was precipitated need to in the procedure at that time whether the ablation had been completed or not. She understands that no guarantee can be made that this  well cure her anemia, but that her menorrhagia is the most likely contributed to that. History and Physical Interval Note:   12/30/2016   8:32 AM   Janne Lab  has presented today for surgery, with the diagnosis of Menorrhagia with Dysfunctional Uterine Bleeding and Uterine Fibroids  The various methods of treatment have been discussed with the patient and family. After consideration of risks, benefits and other options for treatment, the patient has consented to  Procedure(s): DILATATION & CURETTAGE/HYSTEROSCOPY WITH HYDROTHERMAL ABLATION as a surgical intervention .  I have reviewed the patients' chart and labs.  Questions were answered to the patient's satisfaction.     HAYGOOD,VANESSA P  MD   HAYGOOD,VANESSA P 12/30/2016, 8:30 AM

## 2016-12-28 ENCOUNTER — Encounter (HOSPITAL_BASED_OUTPATIENT_CLINIC_OR_DEPARTMENT_OTHER): Admission: RE | Payer: Self-pay | Source: Ambulatory Visit

## 2016-12-28 ENCOUNTER — Encounter (HOSPITAL_COMMUNITY): Payer: Self-pay

## 2016-12-28 ENCOUNTER — Ambulatory Visit (HOSPITAL_BASED_OUTPATIENT_CLINIC_OR_DEPARTMENT_OTHER): Admission: RE | Admit: 2016-12-28 | Payer: 59 | Source: Ambulatory Visit | Admitting: Obstetrics and Gynecology

## 2016-12-28 DIAGNOSIS — E611 Iron deficiency: Secondary | ICD-10-CM | POA: Diagnosis not present

## 2016-12-28 DIAGNOSIS — E559 Vitamin D deficiency, unspecified: Secondary | ICD-10-CM | POA: Diagnosis not present

## 2016-12-28 DIAGNOSIS — D649 Anemia, unspecified: Secondary | ICD-10-CM | POA: Diagnosis not present

## 2016-12-28 HISTORY — DX: Acute posthemorrhagic anemia: D62

## 2016-12-28 HISTORY — DX: Personal history of other infectious and parasitic diseases: Z86.19

## 2016-12-28 HISTORY — DX: Personal history of other complications of pregnancy, childbirth and the puerperium: Z87.59

## 2016-12-28 SURGERY — DILATATION & CURETTAGE/HYSTEROSCOPY WITH HYDROTHERMAL ABLATION
Anesthesia: Choice

## 2016-12-29 NOTE — Anesthesia Preprocedure Evaluation (Addendum)
Anesthesia Evaluation  Patient identified by MRN, date of birth, ID band Patient awake    Reviewed: Allergy & Precautions, NPO status , Patient's Chart, lab work & pertinent test results  History of Anesthesia Complications Negative for: history of anesthetic complications  Airway Mallampati: II  TM Distance: >3 FB Neck ROM: Full    Dental no notable dental hx. (+) Dental Advisory Given   Pulmonary neg pulmonary ROS,    Pulmonary exam normal        Cardiovascular negative cardio ROS Normal cardiovascular exam     Neuro/Psych  Headaches, negative psych ROS   GI/Hepatic negative GI ROS, Neg liver ROS,   Endo/Other  negative endocrine ROS  Renal/GU negative Renal ROS     Musculoskeletal negative musculoskeletal ROS (+)   Abdominal   Peds  Hematology negative hematology ROS (+)   Anesthesia Other Findings Day of surgery medications reviewed with the patient.  Reproductive/Obstetrics                            Anesthesia Physical Anesthesia Plan  ASA: II  Anesthesia Plan: General   Post-op Pain Management:    Induction: Intravenous  Airway Management Planned: LMA  Additional Equipment:   Intra-op Plan:   Post-operative Plan: Extubation in OR  Informed Consent: I have reviewed the patients History and Physical, chart, labs and discussed the procedure including the risks, benefits and alternatives for the proposed anesthesia with the patient or authorized representative who has indicated his/her understanding and acceptance.   Dental advisory given  Plan Discussed with: CRNA, Anesthesiologist and Surgeon  Anesthesia Plan Comments:        Anesthesia Quick Evaluation

## 2016-12-30 ENCOUNTER — Ambulatory Visit (HOSPITAL_COMMUNITY)
Admission: RE | Admit: 2016-12-30 | Discharge: 2016-12-30 | Disposition: A | Discharge status: Home / Self Care | Payer: 59 | Source: Ambulatory Visit | Attending: Obstetrics and Gynecology | Admitting: Obstetrics and Gynecology

## 2016-12-30 ENCOUNTER — Encounter (HOSPITAL_COMMUNITY): Payer: Self-pay | Admitting: Anesthesiology

## 2016-12-30 ENCOUNTER — Ambulatory Visit (HOSPITAL_COMMUNITY): Payer: 59 | Admitting: Anesthesiology

## 2016-12-30 ENCOUNTER — Encounter (HOSPITAL_COMMUNITY)
Admission: RE | Disposition: A | Discharge status: Home / Self Care | Payer: Self-pay | Source: Ambulatory Visit | Attending: Obstetrics and Gynecology

## 2016-12-30 DIAGNOSIS — N938 Other specified abnormal uterine and vaginal bleeding: Secondary | ICD-10-CM | POA: Diagnosis not present

## 2016-12-30 DIAGNOSIS — N920 Excessive and frequent menstruation with regular cycle: Secondary | ICD-10-CM | POA: Insufficient documentation

## 2016-12-30 DIAGNOSIS — D649 Anemia, unspecified: Secondary | ICD-10-CM | POA: Diagnosis not present

## 2016-12-30 DIAGNOSIS — D500 Iron deficiency anemia secondary to blood loss (chronic): Secondary | ICD-10-CM | POA: Diagnosis present

## 2016-12-30 DIAGNOSIS — D252 Subserosal leiomyoma of uterus: Secondary | ICD-10-CM | POA: Diagnosis not present

## 2016-12-30 DIAGNOSIS — D251 Intramural leiomyoma of uterus: Secondary | ICD-10-CM | POA: Diagnosis present

## 2016-12-30 DIAGNOSIS — N924 Excessive bleeding in the premenopausal period: Secondary | ICD-10-CM

## 2016-12-30 HISTORY — PX: DILITATION & CURRETTAGE/HYSTROSCOPY WITH HYDROTHERMAL ABLATION: SHX5570

## 2016-12-30 HISTORY — DX: Headache, unspecified: R51.9

## 2016-12-30 HISTORY — DX: Headache: R51

## 2016-12-30 LAB — CBC
HEMATOCRIT: 31.2 % — AB (ref 36.0–46.0)
HEMOGLOBIN: 9.5 g/dL — AB (ref 12.0–15.0)
MCH: 21.3 pg — AB (ref 26.0–34.0)
MCHC: 30.4 g/dL (ref 30.0–36.0)
MCV: 70.1 fL — ABNORMAL LOW (ref 78.0–100.0)
Platelets: 517 10*3/uL — ABNORMAL HIGH (ref 150–400)
RBC: 4.45 MIL/uL (ref 3.87–5.11)
RDW: 30 % — ABNORMAL HIGH (ref 11.5–15.5)
WBC: 8.8 10*3/uL (ref 4.0–10.5)

## 2016-12-30 LAB — BASIC METABOLIC PANEL
ANION GAP: 6 (ref 5–15)
BUN: 7 mg/dL (ref 6–20)
CHLORIDE: 106 mmol/L (ref 101–111)
CO2: 24 mmol/L (ref 22–32)
Calcium: 9.1 mg/dL (ref 8.9–10.3)
Creatinine, Ser: 0.77 mg/dL (ref 0.44–1.00)
GFR calc non Af Amer: >60 mL/min (ref >60)
GLUCOSE: 107 mg/dL — AB (ref 65–99)
POTASSIUM: 3.5 mmol/L (ref 3.5–5.1)
Sodium: 136 mmol/L (ref 135–145)

## 2016-12-30 LAB — PREGNANCY, URINE: Preg Test, Ur: NEGATIVE

## 2016-12-30 SURGERY — DILATATION & CURETTAGE/HYSTEROSCOPY WITH HYDROTHERMAL ABLATION
Anesthesia: General | Site: Uterus

## 2016-12-30 MED ORDER — CEFAZOLIN SODIUM-DEXTROSE 2-3 GM-% IV SOLR
INTRAVENOUS | Status: DC | PRN
  Administered 2016-12-30: 2 g via INTRAVENOUS

## 2016-12-30 MED ORDER — IBUPROFEN 600 MG PO TABS: ORAL_TABLET | ORAL | 2 refills | Status: AC

## 2016-12-30 MED ORDER — KETOROLAC TROMETHAMINE 30 MG/ML IJ SOLN
INTRAMUSCULAR | Status: AC
  Filled 2016-12-30: qty 1

## 2016-12-30 MED ORDER — MIDAZOLAM HCL 2 MG/2ML IJ SOLN
INTRAMUSCULAR | Status: DC | PRN
  Administered 2016-12-30: 1 mg via INTRAVENOUS

## 2016-12-30 MED ORDER — SCOPOLAMINE 1 MG/3DAYS TD PT72
MEDICATED_PATCH | TRANSDERMAL | Status: AC
  Administered 2016-12-30: 1.5 mg via TRANSDERMAL
  Filled 2016-12-30: qty 1

## 2016-12-30 MED ORDER — LIDOCAINE HCL (CARDIAC) 20 MG/ML IV SOLN
INTRAVENOUS | Status: AC
  Filled 2016-12-30: qty 5

## 2016-12-30 MED ORDER — LIDOCAINE HCL 1 % IJ SOLN
INTRAMUSCULAR | Status: DC | PRN
  Administered 2016-12-30: 8 mL

## 2016-12-30 MED ORDER — FENTANYL CITRATE (PF) 100 MCG/2ML IJ SOLN
INTRAMUSCULAR | Status: DC | PRN
  Administered 2016-12-30 (×2): 50 ug via INTRAVENOUS

## 2016-12-30 MED ORDER — LACTATED RINGERS IV SOLN
INTRAVENOUS | Status: DC
  Administered 2016-12-30 (×2): via INTRAVENOUS

## 2016-12-30 MED ORDER — HYDROMORPHONE HCL 1 MG/ML IJ SOLN
0.2500 mg | INTRAMUSCULAR | Status: DC | PRN
  Administered 2016-12-30 (×3): 0.5 mg via INTRAVENOUS

## 2016-12-30 MED ORDER — MIDAZOLAM HCL 2 MG/2ML IJ SOLN
INTRAMUSCULAR | Status: AC
  Filled 2016-12-30: qty 2

## 2016-12-30 MED ORDER — HYDROMORPHONE HCL 1 MG/ML IJ SOLN
INTRAMUSCULAR | Status: AC
  Administered 2016-12-30: 0.5 mg via INTRAVENOUS
  Filled 2016-12-30: qty 1

## 2016-12-30 MED ORDER — ONDANSETRON HCL 4 MG/2ML IJ SOLN
INTRAMUSCULAR | Status: DC | PRN
  Administered 2016-12-30: 4 mg via INTRAVENOUS

## 2016-12-30 MED ORDER — PROMETHAZINE HCL 25 MG/ML IJ SOLN: 6.2500 mg | INTRAMUSCULAR | Status: DC | PRN

## 2016-12-30 MED ORDER — SCOPOLAMINE 1 MG/3DAYS TD PT72: 1.0000 | MEDICATED_PATCH | TRANSDERMAL | Status: DC

## 2016-12-30 MED ORDER — DEXAMETHASONE SODIUM PHOSPHATE 10 MG/ML IJ SOLN
INTRAMUSCULAR | Status: DC | PRN
  Administered 2016-12-30: 4 mg via INTRAVENOUS

## 2016-12-30 MED ORDER — LIDOCAINE HCL 1 % IJ SOLN
INTRAMUSCULAR | Status: AC
  Filled 2016-12-30: qty 20

## 2016-12-30 MED ORDER — PROPOFOL 10 MG/ML IV BOLUS
INTRAVENOUS | Status: DC | PRN
  Administered 2016-12-30: 180 mg via INTRAVENOUS

## 2016-12-30 MED ORDER — PROPOFOL 10 MG/ML IV BOLUS
INTRAVENOUS | Status: AC
  Filled 2016-12-30: qty 20

## 2016-12-30 MED ORDER — KETOROLAC TROMETHAMINE 30 MG/ML IJ SOLN
INTRAMUSCULAR | Status: DC | PRN
  Administered 2016-12-30: 30 mg via INTRAVENOUS
  Administered 2016-12-30: 30 mg via INTRAMUSCULAR

## 2016-12-30 MED ORDER — SCOPOLAMINE 1 MG/3DAYS TD PT72
1.0000 | MEDICATED_PATCH | Freq: Once | TRANSDERMAL | Status: DC
  Administered 2016-12-30: 1.5 mg via TRANSDERMAL

## 2016-12-30 MED ORDER — DEXAMETHASONE SODIUM PHOSPHATE 4 MG/ML IJ SOLN
INTRAMUSCULAR | Status: AC
  Filled 2016-12-30: qty 1

## 2016-12-30 MED ORDER — SODIUM CHLORIDE 0.9 % IR SOLN
Status: DC | PRN
  Administered 2016-12-30: 3000 mL

## 2016-12-30 MED ORDER — LIDOCAINE HCL (CARDIAC) 20 MG/ML IV SOLN
INTRAVENOUS | Status: DC | PRN
  Administered 2016-12-30: 70 mg via INTRAVENOUS
  Administered 2016-12-30: 30 mg via INTRAVENOUS

## 2016-12-30 MED ORDER — HYDROCODONE-ACETAMINOPHEN 5-300 MG PO TABS: 1.0000 | ORAL_TABLET | ORAL | 0 refills | Status: DC | PRN

## 2016-12-30 MED ORDER — FENTANYL CITRATE (PF) 100 MCG/2ML IJ SOLN
INTRAMUSCULAR | Status: AC
  Filled 2016-12-30: qty 2

## 2016-12-30 MED ORDER — BUPIVACAINE HCL (PF) 0.25 % IJ SOLN
INTRAMUSCULAR | Status: AC
  Filled 2016-12-30: qty 30

## 2016-12-30 MED ORDER — ONDANSETRON HCL 4 MG/2ML IJ SOLN
INTRAMUSCULAR | Status: AC
  Filled 2016-12-30: qty 2

## 2016-12-30 SURGICAL SUPPLY — 14 items
CANISTER SUCT 3000ML (MISCELLANEOUS) ×3 IMPLANT
CATH ROBINSON RED A/P 16FR (CATHETERS) ×3 IMPLANT
CLOTH BEACON ORANGE TIMEOUT ST (SAFETY) ×3 IMPLANT
CONTAINER PREFILL 10% NBF 60ML (FORM) ×6 IMPLANT
DILATOR CANAL MILEX (MISCELLANEOUS) IMPLANT
GLOVE BIOGEL PI IND STRL 7.0 (GLOVE) ×1 IMPLANT
GLOVE BIOGEL PI INDICATOR 7.0 (GLOVE) ×2
GLOVE SURG SS PI 6.5 STRL IVOR (GLOVE) ×3 IMPLANT
GOWN STRL REUS W/TWL LRG LVL3 (GOWN DISPOSABLE) ×6 IMPLANT
PACK VAGINAL MINOR WOMEN LF (CUSTOM PROCEDURE TRAY) ×3 IMPLANT
PAD OB MATERNITY 4.3X12.25 (PERSONAL CARE ITEMS) ×3 IMPLANT
SET GENESYS HTA PROCERVA (MISCELLANEOUS) ×3 IMPLANT
TOWEL OR 17X24 6PK STRL BLUE (TOWEL DISPOSABLE) ×6 IMPLANT
WATER STERILE IRR 1000ML POUR (IV SOLUTION) ×3 IMPLANT

## 2016-12-30 NOTE — Anesthesia Procedure Notes (Signed)
Procedure Name: LMA Insertion Date/Time: 12/30/2016 8:44 AM Performed by: Tobin Chad Pre-anesthesia Checklist: Patient identified, Emergency Drugs available, Suction available and Patient being monitored Patient Re-evaluated:Patient Re-evaluated prior to inductionOxygen Delivery Method: Circle system utilized and Simple face mask Preoxygenation: Pre-oxygenation with 100% oxygen Intubation Type: IV induction Ventilation: Mask ventilation without difficulty LMA Size: 4.0 Grade View: Grade II Tube type: Oral Number of attempts: 1 Placement Confirmation: positive ETCO2 and breath sounds checked- equal and bilateral Dental Injury: Teeth and Oropharynx as per pre-operative assessment

## 2016-12-30 NOTE — Discharge Instructions (Signed)
Do Not take Motrin until after 3:00pm today.   General Anesthesia, Adult, Care After These instructions provide you with information about caring for yourself after your procedure. Your health care provider may also give you more specific instructions. Your treatment has been planned according to current medical practices, but problems sometimes occur. Call your health care provider if you have any problems or questions after your procedure. What can I expect after the procedure? After the procedure, it is common to have:  Vomiting.  A sore throat.  Mental slowness. It is common to feel:  Nauseous.  Cold or shivery.  Sleepy.  Tired.  Sore or achy, even in parts of your body where you did not have surgery. Follow these instructions at home: For at least 24 hours after the procedure:  Do not:  Participate in activities where you could fall or become injured.  Drive.  Use heavy machinery.  Drink alcohol.  Take sleeping pills or medicines that cause drowsiness.  Make important decisions or sign legal documents.  Take care of children on your own.  Rest. Eating and drinking  If you vomit, drink water, juice, or soup when you can drink without vomiting.  Drink enough fluid to keep your urine clear or pale yellow.  Make sure you have little or no nausea before eating solid foods.  Follow the diet recommended by your health care provider. General instructions  Have a responsible adult stay with you until you are awake and alert.  Return to your normal activities as told by your health care provider. Ask your health care provider what activities are safe for you.  Take over-the-counter and prescription medicines only as told by your health care provider.  If you smoke, do not smoke without supervision.  Keep all follow-up visits as told by your health care provider. This is important. Contact a health care provider if:  You continue to have nausea or vomiting  at home, and medicines are not helpful.  You cannot drink fluids or start eating again.  You cannot urinate after 8-12 hours.  You develop a skin rash.  You have fever.  You have increasing redness at the site of your procedure. Get help right away if:  You have difficulty breathing.  You have chest pain.  You have unexpected bleeding.  You feel that you are having a life-threatening or urgent problem. This information is not intended to replace advice given to you by your health care provider. Make sure you discuss any questions you have with your health care provider. Document Released: 03/13/2001 Document Revised: 05/09/2016 Document Reviewed: 11/19/2015 Elsevier Interactive Patient Education  2017 [employer] Dilation and Curettage or Vacuum Curettage, Care After This sheet gives you information about how to care for yourself after your procedure. Your health care provider may also give you more specific instructions. If you have problems or questions, contact your health care provider. What can I expect after the procedure? After your procedure, it is common to have:  Mild pain or cramping.  Some vaginal bleeding or spotting. These may last for up to 2 weeks after your procedure. Follow these instructions at home: Activity  Do not drive or use heavy machinery while taking prescription pain medicine.  Avoid driving for the first 24 hours after your procedure.  Take frequent, short walks, followed by rest periods, throughout the day. Ask your health care provider what activities are safe for you. After 1-2 days, you may be able to return to your normal  activities.  Do not lift anything heavier than 10 lb (4.5 kg) until your health care provider approves.  For at least 2 weeks, or as long as told by your health care provider, do not:  [place].  Use tampons.  Have sexual intercourse. General instructions  Take over-the-counter and prescription medicines only as  told by your health care provider. This is especially important if you take blood thinning medicine.  Do not take baths, swim, or use a hot tub until your health care provider approves. Take showers instead of baths.  Wear compression stockings as told by your health care provider. These stockings help to prevent blood clots and reduce swelling in your legs.  It is your responsibility to get the results of your procedure. Ask your health care provider, or the department performing the procedure, when your results will be ready.  Keep all follow-up visits as told by your health care provider. This is important. Contact a health care provider if:  You have severe cramps that get worse or that do not get better with medicine.  You have severe abdominal pain.  You cannot drink fluids without vomiting.  You develop pain in a different area of your pelvis.  You have bad-smelling vaginal discharge.  You have a rash. Get help right away if:  You have vaginal bleeding that soaks more than one sanitary pad in 1 hour, for 2 hours in a row.  You pass large blood clots from your vagina.  You have a fever that is above 100.82F (38.0C).  Your abdomen feels very tender or hard.  You have chest pain.  You have shortness of breath.  You cough up blood.  You feel dizzy or light-headed.  You faint.  You have pain in your neck or shoulder area. This information is not intended to replace advice given to you by your health care provider. Make sure you discuss any questions you have with your health care provider. Document Released: 12/02/2000 Document Revised: 08/03/2016 Document Reviewed: 07/07/2016 Elsevier Interactive Patient Education  2017 [employer].

## 2016-12-30 NOTE — Anesthesia Postprocedure Evaluation (Signed)
Anesthesia Post Note  Patient: Shirley Griffin  Procedure(s) Performed: Procedure(s) (LRB): DILATATION & CURETTAGE/HYSTEROSCOPY WITH HYDROTHERMAL ABLATION (N/A)  Patient location during evaluation: PACU Anesthesia Type: General Level of consciousness: sedated Pain management: pain level controlled Vital Signs Assessment: post-procedure vital signs reviewed and stable Respiratory status: spontaneous breathing and respiratory function stable Cardiovascular status: stable Anesthetic complications: no        Last Vitals:  Vitals:   12/30/16 1015 12/30/16 1025  BP: (!) 151/92   Pulse: 73 76  Resp: 12 18  Temp:      Last Pain:  Vitals:   12/30/16 1015  TempSrc:   PainSc: 7    Pain Goal: Patients Stated Pain Goal: 3 (12/30/16 0731)               [place]

## 2016-12-30 NOTE — Transfer of Care (Signed)
Immediate Anesthesia Transfer of Care Note  Patient: Shirley Griffin  Procedure(s) Performed: Procedure(s): DILATATION & CURETTAGE/HYSTEROSCOPY WITH HYDROTHERMAL ABLATION (N/A)  Patient Location: PACU  Anesthesia Type:General  Level of Consciousness: awake, alert , sedated and patient cooperative  Airway & Oxygen Therapy: Patient Spontanous Breathing and Patient connected to nasal cannula oxygen  Post-op Assessment: Report given to RN and Post -op Vital signs reviewed and stable  Post vital signs: Reviewed and stable  Last Vitals:  Vitals:   12/30/16 0731  BP: 125/86  Pulse: 83  Resp: 16  Temp: 36.9 C    Last Pain:  Vitals:   12/30/16 0731  TempSrc: Oral      Patients Stated Pain Goal: 3 ([id number])  Complications: No apparent anesthesia complications

## 2016-12-30 NOTE — Op Note (Signed)
12/30/2016  10:02 AM  PATIENT:  Shirley Griffin  46 y.o. female  PRE-OPERATIVE DIAGNOSIS:  Menorrhagia with Dysfunctional Uterine Bleeding and Uterine Fibroids, anemia  POST-OPERATIVE DIAGNOSIS:  MENORRHAGIA WITH DYSFUNCTIONAL UTERINE BLEEDING, fibroids, anemia  PROCEDURE:  Procedure(s): DILATATION & CURETTAGE/HYSTEROSCOPY WITH HYDROTHERMAL ABLATION  SURGEON:  HAYGOOD,VANESSA P, MD  ASSISTANTS: none  ANESTHESIA:   general and paracervical block  ESTIMATED BLOOD LOSS: <10cc  BLOOD ADMINISTERED:none  COMPLICATIONS:none  FINDINGS: The uterus sounded to 12 cm. At hysteroscopy there were several small submucosal myomata, none of which seem to protrude into the crest cavity. Greater than 50% of their diameter. The tubal ostium on the left was visualized. The tubal ostium on the right ordered by a myoma.  LOCAL MEDICATIONS USED:  LIDOCAINE  and Amount: 10 ml  SPECIMEN:  No Specimen  DISPOSITION OF SPECIMEN:  N/A  COUNTS:  YES  DESCRIPTION OF PROCEDURE:the patient was taken to the operating room after appropriate identification and placed on the operating table. After the attainment of adequate general anesthesia she was placed in the lithotomy position.  A timeout was performed. The perineum and vagina were prepped with multiple layers of Betadine. The bladder was emptied with a an in and out catheter. The perineum was draped in sterile field . A bimanual examination was performed.. A gray speculum was placed in the vagina. A paracervical block was achieved with a total of 10 cc of 2% Xylocaine and the 5 and 7:00 positions. The cervix was grasped with a single-tooth tenaculum. The uterus was sounded.  The cervix was then dilated to accommodate the HTA hysteroscope. The hysteroscope was used to evaluate all quadrants of the uterus with the above-noted findings. The hysteroscope was inserted past the level of the internal os and equilibration of normal saline, ensured. The tenaculum  stabilizer was engaged and the tubing element placed with no contact to the patient.  The heating cycle of the HTA apparatus was begun. Once it was complete, the ablation cycle was performed for 10 minutes. The cool down cycle of the HTA apparatus was begun and completed. The hysteroscope and HTA apparatus were removed from the cervix.  All instruments were then removed from the vagina and the patient was awakened from general anesthesia and taken to the recovery room in satisfactory condition having tolerated the procedure well sponge and instrument counts correct. The patient was given Ancef 2 g IV, and Toradol lack 30 mg IV and 30 mg IM  prior to initiation of the ablation.  PLAN OF CARE: Discharge home after postanesthesia care  PATIENT DISPOSITION:  PACU - hemodynamically stable.   Delay start of Pharmacological VTE agent (>24hrs) due to surgical blood loss or risk of bleeding:  SCD hose were used throughout the procedure   HAYGOOD,VANESSA P, MD 10:02 AM

## 2017-01-01 ENCOUNTER — Encounter (HOSPITAL_COMMUNITY): Payer: Self-pay | Admitting: Obstetrics and Gynecology

## 2017-01-03 ENCOUNTER — Encounter: Payer: Self-pay | Admitting: Oncology

## 2017-01-03 ENCOUNTER — Telehealth: Payer: Self-pay | Admitting: Oncology

## 2017-01-03 NOTE — Telephone Encounter (Signed)
Pt returned my call to schedule an appt. Appt has been scheduled for the pt to see Dr. Talbert Cage on 1/26 at 10am. Aware to arrive 30 minutes early. Demographics verified. Letter mailed.

## 2017-01-12 ENCOUNTER — Telehealth: Payer: Self-pay | Admitting: Oncology

## 2017-01-12 NOTE — Telephone Encounter (Signed)
Lft a vm to move the pt's  appt with a different provider

## 2017-01-13 ENCOUNTER — Ambulatory Visit: Payer: 59

## 2017-01-16 ENCOUNTER — Telehealth: Payer: Self-pay | Admitting: Hematology

## 2017-01-16 NOTE — Telephone Encounter (Signed)
Pt cld to reschedule appt from 1/26. APpt has been scheduled for the pt to see Dr. Irene Limbo on 2/5 at 38am. Aware to arrive 30 minutes early. Pt agreed to the appt date and time.

## 2017-01-18 DIAGNOSIS — E559 Vitamin D deficiency, unspecified: Secondary | ICD-10-CM | POA: Diagnosis not present

## 2017-01-18 DIAGNOSIS — D509 Iron deficiency anemia, unspecified: Secondary | ICD-10-CM | POA: Diagnosis not present

## 2017-01-23 ENCOUNTER — Telehealth: Payer: Self-pay | Admitting: Hematology

## 2017-01-23 ENCOUNTER — Ambulatory Visit (HOSPITAL_BASED_OUTPATIENT_CLINIC_OR_DEPARTMENT_OTHER): Payer: 59 | Admitting: Hematology

## 2017-01-23 ENCOUNTER — Ambulatory Visit (HOSPITAL_BASED_OUTPATIENT_CLINIC_OR_DEPARTMENT_OTHER): Payer: 59

## 2017-01-23 ENCOUNTER — Encounter: Payer: Self-pay | Admitting: Hematology

## 2017-01-23 ENCOUNTER — Ambulatory Visit (HOSPITAL_COMMUNITY)
Admission: RE | Admit: 2017-01-23 | Discharge: 2017-01-23 | Disposition: A | Discharge status: Home / Self Care | Payer: 59 | Source: Ambulatory Visit | Attending: Hematology | Admitting: Hematology

## 2017-01-23 VITALS — BP 119/56 | HR 81 | Temp 98.7°F | Resp 18 | Ht 67.0 in | Wt 193.3 lb

## 2017-01-23 DIAGNOSIS — E559 Vitamin D deficiency, unspecified: Secondary | ICD-10-CM

## 2017-01-23 DIAGNOSIS — D472 Monoclonal gammopathy: Secondary | ICD-10-CM | POA: Insufficient documentation

## 2017-01-23 DIAGNOSIS — N920 Excessive and frequent menstruation with regular cycle: Secondary | ICD-10-CM

## 2017-01-23 DIAGNOSIS — D500 Iron deficiency anemia secondary to blood loss (chronic): Secondary | ICD-10-CM | POA: Insufficient documentation

## 2017-01-23 DIAGNOSIS — D509 Iron deficiency anemia, unspecified: Secondary | ICD-10-CM | POA: Diagnosis not present

## 2017-01-23 DIAGNOSIS — D519 Vitamin B12 deficiency anemia, unspecified: Secondary | ICD-10-CM

## 2017-01-23 LAB — IRON AND TIBC
%SAT: 6 % — ABNORMAL LOW (ref 21–57)
IRON: 24 ug/dL — AB (ref 41–142)
TIBC: 431 ug/dL (ref 236–444)
UIBC: 407 ug/dL — ABNORMAL HIGH (ref 120–384)

## 2017-01-23 LAB — CBC & DIFF AND RETIC
BASO%: 0.4 % (ref 0.0–2.0)
Basophils Absolute: 0 10*3/uL (ref 0.0–0.1)
EOS%: 1.8 % (ref 0.0–7.0)
Eosinophils Absolute: 0.1 10*3/uL (ref 0.0–0.5)
HCT: 36 % (ref 34.8–46.6)
HGB: 11 g/dL — ABNORMAL LOW (ref 11.6–15.9)
Immature Retic Fract: 23.8 % — ABNORMAL HIGH (ref 1.60–10.00)
LYMPH#: 2 10*3/uL (ref 0.9–3.3)
LYMPH%: 27.3 % (ref 14.0–49.7)
MCH: 23.9 pg — ABNORMAL LOW (ref 25.1–34.0)
MCHC: 30.6 g/dL — AB (ref 31.5–36.0)
MCV: 78.3 fL — AB (ref 79.5–101.0)
MONO#: 0.4 10*3/uL (ref 0.1–0.9)
MONO%: 4.8 % (ref 0.0–14.0)
NEUT%: 65.7 % (ref 38.4–76.8)
NEUTROS ABS: 4.8 10*3/uL (ref 1.5–6.5)
Platelets: 454 10*3/uL — ABNORMAL HIGH (ref 145–400)
RBC: 4.6 10*6/uL (ref 3.70–5.45)
RDW: 28.9 % — ABNORMAL HIGH (ref 11.2–14.5)
RETIC CT ABS: 29.44 10*3/uL — AB (ref 33.70–90.70)
Retic %: 0.64 % — ABNORMAL LOW (ref 0.70–2.10)
WBC: 7.3 10*3/uL (ref 3.9–10.3)

## 2017-01-23 LAB — FERRITIN: Ferritin: 16 ng/ml (ref 9–269)

## 2017-01-23 LAB — COMPREHENSIVE METABOLIC PANEL
ALT: 19 U/L (ref 0–55)
ANION GAP: 6 meq/L (ref 3–11)
AST: 20 U/L (ref 5–34)
Albumin: 3.8 g/dL (ref 3.5–5.0)
Alkaline Phosphatase: 58 U/L (ref 40–150)
BUN: 7.5 mg/dL (ref 7.0–26.0)
CHLORIDE: 105 meq/L (ref 98–109)
CO2: 24 meq/L (ref 22–29)
Calcium: 9.6 mg/dL (ref 8.4–10.4)
Creatinine: 0.8 mg/dL (ref 0.6–1.1)
Glucose: 99 mg/dl (ref 70–140)
Potassium: 3.6 mEq/L (ref 3.5–5.1)
Sodium: 135 mEq/L — ABNORMAL LOW (ref 136–145)
Total Bilirubin: 0.28 mg/dL (ref 0.20–1.20)
Total Protein: 8.9 g/dL — ABNORMAL HIGH (ref 6.4–8.3)

## 2017-01-23 LAB — LACTATE DEHYDROGENASE: LDH: 145 U/L (ref 125–245)

## 2017-01-23 MED ORDER — ERGOCALCIFEROL 1.25 MG (50000 UT) PO CAPS: 50000.0000 [IU] | ORAL_CAPSULE | ORAL | 0 refills | Status: AC

## 2017-01-23 MED ORDER — B COMPLEX VITAMINS PO CAPS: 1.0000 | ORAL_CAPSULE | Freq: Every day | ORAL | 3 refills | Status: AC

## 2017-01-23 NOTE — Patient Instructions (Signed)
plz start taking 1 tab of B complex po daily Vit D (Ergocalciferol ) 50,0000 IU once weekly x 12 doses (sent to your pharmacy)

## 2017-01-23 NOTE — Progress Notes (Signed)
[name]    HEMATOLOGY/ONCOLOGY CONSULTATION NOTE  Date of Service: 01/23/2017  Patient Care Team: [name], MD as PCP - General (Internal Medicine)  CHIEF COMPLAINTS/PURPOSE OF CONSULTATION:  Anemia and  MGUS  HISTORY OF PRESENTING ILLNESS:   Shirley Griffin is a wonderful 46 y.o. female who has been referred to [place] by Dr .[name], MD for evaluation and management of Anemia and M spike.  Patient was admitted to the GYN in mid January 2018 for severe menorrhagia thought to be related to dysfunctional uterine bleeding and fibroids. Her hemoglobin was down to 6.1 with an MCV of 61 normal WBC count of 9.3k elevated platelets of 606k.  Patient received PRBC transfusions For her severe symptomatic anemia. Posttransfusion hemoglobin was 9.5. She has also been started on oral iron and has been taking ferrous sulfate 1 tablet by mouth twice a day with vitamin C. This has bothered her with significant constipation since she has also had chronic constipation for which she is on Linzess.   Patient had a D&C and endometrial ablation to control her severe menorrhagia on 12/30/2016   patient had additional anemia workup with her primary care physician which included SPEP which showed an M spike of 1.8 g/dL. UPEP showed no M spike. Folic acid within normal limits. Celiac disease panel was done which was unrevealing.  Given persistent severe iron deficiency despite oral iron replacement, borderline tolerance and monoclonal paraproteinemia the patient was referred to [place] for further evaluation and management.   Patient notes no focal bone pains. Her labs with her primary care physician showed no renal insufficiency or hypercalcemia. TSH was within normal limits.  Patient notes no overt GI bleeding or other blood loss other than severe menorrhagia.patient notes that she's had colonoscopy In the last year which was noted to be negative. Patient notes that her menorrhagia has reduced after her D&C and endometrial  ablation.  MEDICAL HISTORY:  Past Medical History:  Diagnosis Date  . Anemia associated with acute blood loss    11-28-2016 symptomatic  HG 6.2  transfused x1 11-29-2016  . H/O varicella   . Headache   . History of bacterial infection   . History of gestational diabetes mellitus, not pregnant   . History of gestational hypertension   . History of trichomoniasis   Hypertension Gestational diabetes Eczema Allergic rhinitis  menorrhagia related to dysfunctional uterine bleeding and fibroids  Chronic iron deficiency  Vitamin D deficiency  Chronic constipation on Linzess.  SURGICAL HISTORY: Past Surgical History:  Procedure Laterality Date  . CESAREAN SECTION  06/23/2001  . CESAREAN SECTION W/BTL  08/25/2007  . DILITATION & CURRETTAGE/HYSTROSCOPY WITH HYDROTHERMAL ABLATION N/A 12/30/2016   Procedure: DILATATION & CURETTAGE/HYSTEROSCOPY WITH HYDROTHERMAL ABLATION;  Surgeon: Eldred Manges, MD;  Location: [facility];  Service: Gynecology;  Laterality: N/A;  . WISDOM TOOTH EXTRACTION      SOCIAL HISTORY: Social History   Social History  . Marital status: Married    Spouse name: N/A  . Number of children: N/A  . Years of education: N/A   Occupational History  . Not on file.   Social History Main Topics  . Smoking status: Never Smoker  . Smokeless tobacco: Never Used  . Alcohol use Yes  . Drug use: No  . Sexual activity: Yes    Partners: Male    Birth control/ protection: Surgical     Comment: tubal ligation   Other Topics Concern  . Not on file   Social History Narrative  .  No narrative on file    FAMILY HISTORY: Family History  Problem Relation Age of Onset  . Cancer Paternal Grandfather   . Hypertension Paternal Grandfather   . Hypertension Paternal Grandmother   . Hypertension Maternal Grandmother   . Heart attack Maternal Grandfather   . Hypertension Father   . Diabetes Mother     ALLERGIES:  is allergic to floxin [ofloxacin].  MEDICATIONS:    Current Outpatient Prescriptions  Medication Sig Dispense Refill  . clobetasol cream (TEMOVATE) 3.50 % Apply 1 application topically daily as needed (eczema).     . EUCRISA 2 % OINT Apply 1 application topically daily as needed (eczema).   0  . ferrous sulfate 325 (65 FE) MG tablet Take 1 tablet (325 mg total) by mouth daily. (Patient taking differently: Take 325 mg by mouth 2 (two) times daily. ) 30 tablet 1  . Hydrocodone-Acetaminophen 5-300 MG TABS Take 1 tablet by mouth every 4 (four) hours as needed. 12 each 0  . ibuprofen (ADVIL,MOTRIN) 600 MG tablet Ibuprofen 600 mg by mouth every 6 hours for 3 days, then every 6 hours as needed for pain 60 tablet 2   No current facility-administered medications for this visit.     REVIEW OF SYSTEMS:    10 Point review of Systems was done is negative except as noted above.  PHYSICAL EXAMINATION: ECOG PERFORMANCE STATUS: 1 - Symptomatic but completely ambulatory  . Vitals:   01/23/17 0900  BP: (!) 119/56  Pulse: 81  Resp: 18  Temp: 98.7 F (37.1 C)   Filed Weights   01/23/17 0900  Weight: 193 lb 4.8 oz (87.7 kg)   .Body mass index is 30.28 kg/m.  GENERAL:alert, in no acute distress and comfortable SKIN: skin color, texture, turgor are normal, no rashes or significant lesions EYES: normal, conjunctiva are pink and non-injected, sclera clear OROPHARYNX:no exudate, no erythema and lips, buccal mucosa, and tongue normal  NECK: supple, no JVD, thyroid normal size, non-tender, without nodularity LYMPH:  no palpable lymphadenopathy in the cervical, axillary or inguinal LUNGS: clear to auscultation with normal respiratory effort HEART: regular rate & rhythm,  no murmurs and no lower extremity edema ABDOMEN: abdomen soft, non-tender, normoactive bowel sounds  no palpable hepatosplenomegaly  Musculoskeletal: no tenderness to palpation over the spine PSYCH: alert & oriented x 3 with fluent speech NEURO: no focal motor/sensory  deficits  LABORATORY DATA:  I have reviewed the data as listed  . CBC Latest Ref Rng & Units 01/23/2017 01/23/2017 12/30/2016  WBC 3.9 - 10.3 10e3/uL 7.3 - 8.8  Hemoglobin 11.6 - 15.9 g/dL 11.0(L) - 9.5(L)  Hematocrit 34.0 - 46.6 % 36.0 35.3 31.2(L)  Platelets 145 - 400 10e3/uL 454(H) - 517(H)   . CBC    Component Value Date/Time   WBC 7.3 01/23/2017 1023   WBC 8.8 12/30/2016 0731   RBC 4.60 01/23/2017 1023   RBC 4.45 12/30/2016 0731   HGB 11.0 (L) 01/23/2017 1023   HCT 35.3 01/23/2017 1023   HCT 36.0 01/23/2017 1023   PLT 454 (H) 01/23/2017 1023   MCV 78.3 (L) 01/23/2017 1023   MCH 23.9 (L) 01/23/2017 1023   MCH 21.3 (L) 12/30/2016 0731   MCHC 30.6 (L) 01/23/2017 1023   MCHC 30.4 12/30/2016 0731   RDW 28.9 (H) 01/23/2017 1023   LYMPHSABS 2.0 01/23/2017 1023   MONOABS 0.4 01/23/2017 1023   EOSABS 0.1 01/23/2017 1023   BASOSABS 0.0 01/23/2017 1023    . CMP Latest Ref Rng &  Units 01/23/2017 01/23/2017 12/30/2016  Glucose 70 - 140 mg/dl 99 - 107(H)  BUN 7.0 - 26.0 mg/dL 7.5 - 7  Creatinine 0.6 - 1.1 mg/dL 0.8 - 0.77  Sodium 136 - 145 mEq/L 135(L) - 136  Potassium 3.5 - 5.1 mEq/L 3.6 - 3.5  Chloride 101 - 111 mmol/L - - 106  CO2 22 - 29 mEq/L 24 - 24  Calcium 8.4 - 10.4 mg/dL 9.6 - 9.1  Total Protein 6.0 - 8.5 g/dL 8.9(H) 7.9 -  Total Bilirubin 0.20 - 1.20 mg/dL 0.28 - -  Alkaline Phos 40 - 150 U/L 58 - -  AST 5 - 34 U/L 20 - -  ALT 0 - 55 U/L 19 - -    . Lab Results  Component Value Date   IRON 24 (L) 01/23/2017   TIBC 431 01/23/2017   IRONPCTSAT 6 (L) 01/23/2017   (Iron and TIBC)  Lab Results  Component Value Date   FERRITIN 16 01/23/2017   Component     Latest Ref Rng & Units 01/23/2017  Folate, Hemolysate     Not Estab. ng/mL 456.7  HCT     34.0 - 46.6 % 35.3  Folate, RBC     >498 ng/mL 1,294  Vitamin B12     232 - 1,245 pg/mL 343  Folate     >3.0 ng/mL 18.3  Sed Rate     0 - 32 mm/hr 31  LDH     125 - 245 U/L 145  Beta 2     0.6 - 2.4 mg/L 1.3         RADIOGRAPHIC STUDIES: I have personally reviewed the radiological images as listed and agreed with the findings in the report. Dg Bone Survey Met  Result Date: 01/23/2017 CLINICAL DATA:  Iron deficient anemia.  Monoclonal paraproteinemia. EXAM: METASTATIC BONE SURVEY COMPARISON:  None. FINDINGS: Old healed fracture is seen involving the right tibial shaft. No lytic destruction or other abnormality is seen in the visualized skeleton, including the spine, ribs, pelvis, extremities in skull IMPRESSION: No evidence of lytic destruction seen in the visualized skeleton. Electronically Signed   By: Marijo Conception, M.D.   On: 01/23/2017 16:03    ASSESSMENT & PLAN:   46 year old female with   #1 Severe microcytic anemia due to iron deficiency. Patient has a monoclonal paraproteinemia but unclear that this is not relating to her anemia currently.  #2 severe iron deficiency likely related to severe menorrhagia from dysfunctional uterine bleeding and uterine fibroids.  Patient had dropped her hemoglobin to 6.1 requiring PRBC transfusions . She notes that her menorrhagia has significantly improved after her D&C and endometrial ablation on 12/30/2016 . Patient notes she is recently had a colonoscopy which was negative . No overt evidence of GI bleeding or other blood loss   #3 poor  tolerance to oral iron due to significant constipation. This is complicating her chronic constipation that has required treatment with linzess Plan -Given severe iron deficiency  and intolerance to oral iron we discussed the pros and cons of replacing her iron IV. -Informed consent was opted for IV iron. -She will be set up for IV injectafer for 750 mg every weekly 2 doses. -Reasonable to take a daily multivitamin to support accelerated hematopoiesis.  #4 IgG lambda monoclonal paraproteinemia Patient has an M spike of 1.9 g/dL Elevated lambda free light chains with an abnormal kappa Lambda ratio UPEP with  primary care physician was negative for monoclonal protein. No hypercalcemia and  no renal insufficiency no bone pains. Whole body skeletal survey shows no overt lytic lesions in the visualized skeleton. Anemia does not appear to be related to her paraproteinemia currently. Plan -MGUS r/o Multiple myeloma. -Will discuss with patient timing/consent for possible bone marrow examination.  #5 Vit D deficiency -started on Ergocalciferol 50k units weekly x 12 doses -continue f/u with PCP  Labs today Whole body X-ray IV Iron (Injectafer) qweekly x 2 doses RTC with Dr Irene Limbo in 6 weeks with labs   All of the patients questions were answered with apparent satisfaction. The patient knows to call the clinic with any problems, questions or concerns.  I spent 50 minutes counseling the patient face to face. The total time spent in the appointment was 60 minutes and more than 50% was on counseling and direct patient cares.    Sullivan Lone MD [place] AAHIVMS [facility] Hematology/Oncology Physician [facility]  (Office):       [phone] (Work cell):  [phone] (Fax):           [phone]  01/23/2017 12:58 AM

## 2017-01-23 NOTE — Telephone Encounter (Signed)
Appointments scheduled per 2/5 LOS. Patient given AVS report and calendars with future scheduled appointments. °

## 2017-01-24 LAB — KAPPA/LAMBDA LIGHT CHAINS
Ig Kappa Free Light Chain: 12 mg/L (ref 3.3–19.4)
Ig Lambda Free Light Chain: 137.7 mg/L — ABNORMAL HIGH (ref 5.7–26.3)
KAPPA/LAMBDA FLC RATIO: 0.09 — AB (ref 0.26–1.65)

## 2017-01-24 LAB — VITAMIN B12: VITAMIN B 12: 343 pg/mL (ref 232–1245)

## 2017-01-24 LAB — SEDIMENTATION RATE: SED RATE: 31 mm/h (ref 0–32)

## 2017-01-24 LAB — FOLATE RBC
Folate, Hemolysate: 456.7 ng/mL
Hematocrit: 35.3 % (ref 34.0–46.6)

## 2017-01-24 LAB — FOLATE: Folate: 18.3 ng/mL (ref >3.0)

## 2017-01-24 LAB — BETA 2 MICROGLOBULIN, SERUM: Beta-2: 1.3 mg/L (ref 0.6–2.4)

## 2017-01-25 LAB — MULTIPLE MYELOMA PANEL, SERUM
ALBUMIN/GLOB SERPL: 0.8 (ref 0.7–1.7)
ALPHA2 GLOB SERPL ELPH-MCNC: 0.6 g/dL (ref 0.4–1.0)
Albumin SerPl Elph-Mcnc: 3.5 g/dL (ref 2.9–4.4)
Alpha 1: 0.2 g/dL (ref 0.0–0.4)
B-GLOBULIN SERPL ELPH-MCNC: 1.2 g/dL (ref 0.7–1.3)
GAMMA GLOB SERPL ELPH-MCNC: 2.4 g/dL — AB (ref 0.4–1.8)
GLOBULIN, TOTAL: 4.4 g/dL — AB (ref 2.2–3.9)
IgA, Qn, Serum: 119 mg/dL (ref 87–352)
IgM, Qn, Serum: 42 mg/dL (ref 26–217)
M PROTEIN SERPL ELPH-MCNC: 1.9 g/dL — AB
Total Protein: 7.9 g/dL (ref 6.0–8.5)

## 2017-01-26 DIAGNOSIS — R3915 Urgency of urination: Secondary | ICD-10-CM | POA: Diagnosis not present

## 2017-01-30 ENCOUNTER — Ambulatory Visit (HOSPITAL_BASED_OUTPATIENT_CLINIC_OR_DEPARTMENT_OTHER): Payer: 59

## 2017-01-30 VITALS — BP 146/97 | HR 81 | Temp 98.6°F | Resp 17

## 2017-01-30 DIAGNOSIS — D509 Iron deficiency anemia, unspecified: Secondary | ICD-10-CM

## 2017-01-30 DIAGNOSIS — D500 Iron deficiency anemia secondary to blood loss (chronic): Secondary | ICD-10-CM

## 2017-01-30 MED ORDER — SODIUM CHLORIDE 0.9 % IV SOLN
750.0000 mg | Freq: Once | INTRAVENOUS | Status: AC
  Administered 2017-01-30: 750 mg via INTRAVENOUS
  Filled 2017-01-30: qty 15

## 2017-01-30 NOTE — Patient Instructions (Signed)
Ferric carboxymaltose injection What is this medicine? FERRIC CARBOXYMALTOSE (ferr-ik car-box-ee-mol-toes) is an iron complex. Iron is used to make healthy red blood cells, which carry oxygen and nutrients throughout the body. This medicine is used to treat anemia in people with chronic kidney disease or people who cannot take iron by mouth. COMMON BRAND NAME(S): Injectafer What should I tell my health care provider before I take this medicine? They need to know if you have any of these conditions: -anemia not caused by low iron levels -high levels of iron in the blood -liver disease -an unusual or allergic reaction to iron, other medicines, foods, dyes, or preservatives -pregnant or trying to get pregnant -breast-feeding How should I use this medicine? This medicine is for infusion into a vein. It is given by a health care professional in a hospital or clinic setting. Talk to your pediatrician regarding the use of this medicine in children. Special care may be needed. What if I miss a dose? It is important not to miss your dose. Call your doctor or health care professional if you are unable to keep an appointment. What may interact with this medicine? Do not take this medicine with any of the following medications: -deferoxamine -dimercaprol -other iron products This medicine may also interact with the following medications: -chloramphenicol -deferasirox What should I watch for while using this medicine? Visit your doctor or health care professional regularly. Tell your doctor if your symptoms do not start to get better or if they get worse. You may need blood work done while you are taking this medicine. You may need to follow a special diet. Talk to your doctor. Foods that contain iron include: whole grains/cereals, dried fruits, beans, or peas, leafy green vegetables, and organ meats (liver, kidney). What side effects may I notice from receiving this medicine? Side effects that you  should report to your doctor or health care professional as soon as possible: -allergic reactions like skin rash, itching or hives, swelling of the face, lips, or tongue -breathing problems -changes in blood pressure -feeling faint or lightheaded, falls -flushing, sweating, or hot feelings Side effects that usually do not require medical attention (report to your doctor or health care professional if they continue or are bothersome): -changes in taste -constipation -dizziness -headache -nausea -pain, redness, or irritation at site where injected -vomiting Where should I keep my medicine? This drug is given in a hospital or clinic and will not be stored at home.  2017 Elsevier/Gold Standard (2016-01-07 11:20:47)  

## 2017-02-06 ENCOUNTER — Ambulatory Visit (HOSPITAL_BASED_OUTPATIENT_CLINIC_OR_DEPARTMENT_OTHER): Payer: 59

## 2017-02-06 VITALS — BP 119/63 | HR 89 | Temp 98.3°F | Resp 18

## 2017-02-06 DIAGNOSIS — D509 Iron deficiency anemia, unspecified: Secondary | ICD-10-CM

## 2017-02-06 DIAGNOSIS — D500 Iron deficiency anemia secondary to blood loss (chronic): Secondary | ICD-10-CM

## 2017-02-06 MED ORDER — FERRIC CARBOXYMALTOSE 750 MG/15ML IV SOLN
750.0000 mg | Freq: Once | INTRAVENOUS | Status: AC
  Administered 2017-02-06: 750 mg via INTRAVENOUS
  Filled 2017-02-06: qty 15

## 2017-02-06 NOTE — Patient Instructions (Signed)
Ferric carboxymaltose injection What is this medicine? FERRIC CARBOXYMALTOSE (ferr-ik car-box-ee-mol-toes) is an iron complex. Iron is used to make healthy red blood cells, which carry oxygen and nutrients throughout the body. This medicine is used to treat anemia in people with chronic kidney disease or people who cannot take iron by mouth. COMMON BRAND NAME(S): Injectafer What should I tell my health care provider before I take this medicine? They need to know if you have any of these conditions: -anemia not caused by low iron levels -high levels of iron in the blood -liver disease -an unusual or allergic reaction to iron, other medicines, foods, dyes, or preservatives -pregnant or trying to get pregnant -breast-feeding How should I use this medicine? This medicine is for infusion into a vein. It is given by a health care professional in a hospital or clinic setting. Talk to your pediatrician regarding the use of this medicine in children. Special care may be needed. What if I miss a dose? It is important not to miss your dose. Call your doctor or health care professional if you are unable to keep an appointment. What may interact with this medicine? Do not take this medicine with any of the following medications: -deferoxamine -dimercaprol -other iron products This medicine may also interact with the following medications: -chloramphenicol -deferasirox What should I watch for while using this medicine? Visit your doctor or health care professional regularly. Tell your doctor if your symptoms do not start to get better or if they get worse. You may need blood work done while you are taking this medicine. You may need to follow a special diet. Talk to your doctor. Foods that contain iron include: whole grains/cereals, dried fruits, beans, or peas, leafy green vegetables, and organ meats (liver, kidney). What side effects may I notice from receiving this medicine? Side effects that you  should report to your doctor or health care professional as soon as possible: -allergic reactions like skin rash, itching or hives, swelling of the face, lips, or tongue -breathing problems -changes in blood pressure -feeling faint or lightheaded, falls -flushing, sweating, or hot feelings Side effects that usually do not require medical attention (report to your doctor or health care professional if they continue or are bothersome): -changes in taste -constipation -dizziness -headache -nausea -pain, redness, or irritation at site where injected -vomiting Where should I keep my medicine? This drug is given in a hospital or clinic and will not be stored at home.  2017 Elsevier/Gold Standard (2016-01-07 11:20:47)  

## 2017-02-13 ENCOUNTER — Other Ambulatory Visit: Payer: Self-pay | Admitting: Hematology

## 2017-02-13 ENCOUNTER — Other Ambulatory Visit: Payer: Self-pay | Admitting: *Deleted

## 2017-02-13 DIAGNOSIS — D472 Monoclonal gammopathy: Secondary | ICD-10-CM

## 2017-02-13 DIAGNOSIS — D508 Other iron deficiency anemias: Secondary | ICD-10-CM

## 2017-02-13 DIAGNOSIS — D500 Iron deficiency anemia secondary to blood loss (chronic): Secondary | ICD-10-CM

## 2017-02-17 ENCOUNTER — Other Ambulatory Visit: Payer: Self-pay | Admitting: Radiology

## 2017-02-20 ENCOUNTER — Encounter (HOSPITAL_COMMUNITY): Payer: Self-pay

## 2017-02-20 ENCOUNTER — Ambulatory Visit (HOSPITAL_COMMUNITY)
Admission: RE | Admit: 2017-02-20 | Discharge: 2017-02-20 | Disposition: A | Discharge status: Home / Self Care | Payer: 59 | Source: Ambulatory Visit | Attending: Hematology | Admitting: Hematology

## 2017-02-20 DIAGNOSIS — D472 Monoclonal gammopathy: Secondary | ICD-10-CM | POA: Diagnosis not present

## 2017-02-20 DIAGNOSIS — D4989 Neoplasm of unspecified behavior of other specified sites: Secondary | ICD-10-CM | POA: Diagnosis not present

## 2017-02-20 DIAGNOSIS — D649 Anemia, unspecified: Secondary | ICD-10-CM | POA: Diagnosis not present

## 2017-02-20 DIAGNOSIS — D500 Iron deficiency anemia secondary to blood loss (chronic): Secondary | ICD-10-CM | POA: Insufficient documentation

## 2017-02-20 DIAGNOSIS — R718 Other abnormality of red blood cells: Secondary | ICD-10-CM | POA: Diagnosis not present

## 2017-02-20 LAB — CBC
HEMATOCRIT: 38.5 % (ref 36.0–46.0)
HEMOGLOBIN: 12.6 g/dL (ref 12.0–15.0)
MCH: 27.5 pg (ref 26.0–34.0)
MCHC: 32.7 g/dL (ref 30.0–36.0)
MCV: 83.9 fL (ref 78.0–100.0)
Platelets: 357 10*3/uL (ref 150–400)
RBC: 4.59 MIL/uL (ref 3.87–5.11)
RDW: 24.7 % — AB (ref 11.5–15.5)
WBC: 6.6 10*3/uL (ref 4.0–10.5)

## 2017-02-20 LAB — APTT: APTT: 26 s (ref 24–36)

## 2017-02-20 LAB — PROTIME-INR
INR: 0.95
Prothrombin Time: 12.7 seconds (ref 11.4–15.2)

## 2017-02-20 LAB — HCG, SERUM, QUALITATIVE: Preg, Serum: NEGATIVE

## 2017-02-20 LAB — BONE MARROW EXAM

## 2017-02-20 MED ORDER — FENTANYL CITRATE (PF) 100 MCG/2ML IJ SOLN
INTRAMUSCULAR | Status: AC | PRN
  Administered 2017-02-20 (×2): 50 ug via INTRAVENOUS

## 2017-02-20 MED ORDER — NALOXONE HCL 0.4 MG/ML IJ SOLN
INTRAMUSCULAR | Status: AC
  Filled 2017-02-20: qty 1

## 2017-02-20 MED ORDER — FENTANYL CITRATE (PF) 100 MCG/2ML IJ SOLN
INTRAMUSCULAR | Status: AC
  Filled 2017-02-20: qty 6

## 2017-02-20 MED ORDER — MIDAZOLAM HCL 2 MG/2ML IJ SOLN
INTRAMUSCULAR | Status: AC | PRN
  Administered 2017-02-20 (×3): 1 mg via INTRAVENOUS

## 2017-02-20 MED ORDER — FLUMAZENIL 0.5 MG/5ML IV SOLN
INTRAVENOUS | Status: AC
  Filled 2017-02-20: qty 5

## 2017-02-20 MED ORDER — SODIUM CHLORIDE 0.9 % IV SOLN
INTRAVENOUS | Status: DC
  Administered 2017-02-20: 07:00:00 via INTRAVENOUS

## 2017-02-20 MED ORDER — MIDAZOLAM HCL 2 MG/2ML IJ SOLN
INTRAMUSCULAR | Status: AC
  Filled 2017-02-20: qty 6

## 2017-02-20 NOTE — Procedures (Signed)
CT-guided  R iliac bone marrow aspiration and core biopsy No complication No blood loss. See complete dictation in [facility] PACS  

## 2017-02-20 NOTE — Consult Note (Signed)
  Chief Complaint: Patient was seen in consultation today for CT guided bone marrow biopsy  Referring Physician(s): Kale,Gautam Kishore  Supervising Physician: Hassell, Daniel  Patient Status: [facility] - Out-pt  History of Present Illness: Shirley Griffin is a 45 y.o. female with history of anemia and MGUS who presents today for CT-guided bone marrow biopsy for further evaluation.  Past Medical History:  Diagnosis Date  . Anemia associated with acute blood loss    11-28-2016 symptomatic  HG 6.2  transfused x1 11-29-2016  . H/O varicella   . Headache   . History of bacterial infection   . History of gestational diabetes mellitus, not pregnant   . History of gestational hypertension   . History of trichomoniasis     Past Surgical History:  Procedure Laterality Date  . CESAREAN SECTION  06/23/2001  . CESAREAN SECTION W/BTL  08/25/2007  . DILITATION & CURRETTAGE/HYSTROSCOPY WITH HYDROTHERMAL ABLATION N/A 12/30/2016   Procedure: DILATATION & CURETTAGE/HYSTEROSCOPY WITH HYDROTHERMAL ABLATION;  Surgeon: Vanessa P Haygood, MD;  Location: [facility];  Service: Gynecology;  Laterality: N/A;  . WISDOM TOOTH EXTRACTION      Allergies: Floxin [ofloxacin]  Medications: Prior to Admission medications   Medication Sig Start Date End Date Taking? Authorizing Provider  b complex vitamins capsule Take 1 capsule by mouth daily. 01/23/17  Yes Gautam Kishore Kale, MD  clobetasol cream (TEMOVATE) 0.05 % Apply 1 application topically daily as needed (eczema).    Yes Historical Provider, MD  ergocalciferol (VITAMIN D2) 50000 units capsule Take 1 capsule (50,000 Units total) by mouth once a week. 01/23/17  Yes Gautam Kishore Kale, MD  EUCRISA 2 % OINT Apply 1 application topically daily as needed (eczema).  11/30/16  Yes Historical Provider, MD  ferrous sulfate 325 (65 FE) MG tablet Take 1 tablet (325 mg total) by mouth daily. Patient taking differently: Take 325 mg by mouth 2 (two) times daily.  11/29/16   Yes Kelly Humes, PA-C  Hydrocodone-Acetaminophen 5-300 MG TABS Take 1 tablet by mouth every 4 (four) hours as needed. 12/30/16  Yes Vanessa P Haygood, MD  ibuprofen (ADVIL,MOTRIN) 600 MG tablet Ibuprofen 600 mg by mouth every 6 hours for 3 days, then every 6 hours as needed for pain 12/30/16  Yes Vanessa P Haygood, MD     Family History  Problem Relation Age of Onset  . Cancer Paternal Grandfather   . Hypertension Paternal Grandfather   . Hypertension Paternal Grandmother   . Hypertension Maternal Grandmother   . Heart attack Maternal Grandfather   . Hypertension Father   . Diabetes Mother     Social History   Social History  . Marital status: Married    Spouse name: N/A  . Number of children: N/A  . Years of education: N/A   Social History Main Topics  . Smoking status: Never Smoker  . Smokeless tobacco: Never Used  . Alcohol use Yes  . Drug use: No  . Sexual activity: Yes    Partners: Male    Birth control/ protection: Surgical     Comment: tubal ligation   Other Topics Concern  . None   Social History Narrative  . None      Review of Systems Currently denies fever, chest pain, dyspnea, cough, abdominal/back pain, nausea, vomiting or abnormal bleeding. She does have occasional headaches.  Vital Signs: blood pressure 133/79, heart rate 76, temperature 98.2, respirations 18, O2 sat 100% room air LMP 01/30/2017   Physical Exam Awake, alert. Chest clear to   auscultation bilaterally. Heart with regular rate and rhythm. Abdomen soft, positive bowel sounds, nontender. Lower extremities with no edema.  Mallampati Score:     Imaging: Dg Bone Survey Met  Result Date: 01/23/2017 CLINICAL DATA:  Iron deficient anemia.  Monoclonal paraproteinemia. EXAM: METASTATIC BONE SURVEY COMPARISON:  None. FINDINGS: Old healed fracture is seen involving the right tibial shaft. No lytic destruction or other abnormality is seen in the visualized skeleton, including the spine, ribs,  pelvis, extremities in skull IMPRESSION: No evidence of lytic destruction seen in the visualized skeleton. Electronically Signed   By: James  Green Jr, M.D.   On: 01/23/2017 16:03    Labs:  CBC:  Recent Labs  11/28/16 1939 11/28/16 1952 12/30/16 0731 01/23/17 1023 01/23/17 1023 02/20/17 0718  WBC 9.3  --  8.8 7.3  --  6.6  HGB 6.1* 8.2* 9.5* 11.0*  --  12.6  HCT 21.9* 24.0* 31.2* 36.0 35.3 38.5  PLT 606*  --  517* 454*  --  357    COAGS:  Recent Labs  02/20/17 0718  INR 0.95  APTT 26    BMP:  Recent Labs  11/28/16 1952 12/30/16 0731 01/23/17 1023  NA 140 136 135*  K 3.4* 3.5 3.6  CL 103 106  --   CO2  --  24 24  GLUCOSE 93 107* 99  BUN 6 7 7.5  CALCIUM  --  9.1 9.6  CREATININE 0.80 0.77 0.8  GFRNONAA  --  >60  --   GFRAA  --  >60  --     LIVER FUNCTION TESTS:  Recent Labs  01/23/17 1023 01/23/17 1023  BILITOT 0.28  --   AST 20  --   ALT 19  --   ALKPHOS 58  --   PROT 8.9* 7.9  ALBUMIN 3.8  --     TUMOR MARKERS: No results for input(s): AFPTM, CEA, CA199, CHROMGRNA in the last 8760 hours.  Assessment and Plan: 45 y.o. female with history of anemia and MGUS who presents today for CT-guided bone marrow biopsy for further evaluation.Risks and benefits discussed with the patient/spouse including, but not limited to bleeding, infection, damage to adjacent structures or low yield requiring additional tests.All of the patient's questions were answered, patient is agreeable to proceed.Consent signed and in chart.     Thank you for this interesting consult.  I greatly enjoyed meeting Shirley Griffin and look forward to participating in their care.  A copy of this report was sent to the requesting provider on this date.  Electronically Signed: D. Kevin Allred 02/20/2017, 8:23 AM   I spent a total of 20 minutes  in face to face in clinical consultation, greater than 50% of which was counseling/coordinating care for CT-guided bone marrow  biopsy        

## 2017-02-20 NOTE — Discharge Instructions (Signed)
Bone Marrow Aspiration and Bone Marrow Biopsy, Adult, Care After °This sheet gives you information about how to care for yourself after your procedure. Your health care provider may also give you more specific instructions. If you have problems or questions, contact your health care provider. °What can I expect after the procedure? °After the procedure, it is common to have: °· Mild pain and tenderness. °· Swelling. °· Bruising. °Follow these instructions at home: °· Take over-the-counter or prescription medicines only as told by your health care provider. °· Do not take baths, swim, or use a hot tub until your health care provider approves. Ask if you can take a shower or have a sponge bath. °· Follow instructions from your health care provider about how to take care of the puncture site. Make sure you: °¨ Wash your hands with soap and water before you change your bandage (dressing). If soap and water are not available, use hand sanitizer. °¨ Change your dressing as told by your health care provider. °· Check your puncture site every day for signs of infection. Check for: °¨ More redness, swelling, or pain. °¨ More fluid or blood. °¨ Warmth. °¨ Pus or a bad smell. °· Return to your normal activities as told by your health care provider. Ask your health care provider what activities are safe for you. °· Do not drive for 24 hours if you were given a medicine to help you relax (sedative). °· Keep all follow-up visits as told by your health care provider. This is important. °Contact a health care provider if: °· You have more redness, swelling, or pain around the puncture site. °· You have more fluid or blood coming from the puncture site. °· Your puncture site feels warm to the touch. °· You have pus or a bad smell coming from the puncture site. °· You have a fever. °· Your pain is not controlled with medicine. °This information is not intended to replace advice given to you by your health care provider. Make sure you  discuss any questions you have with your health care provider. °Document Released: 06/24/2005 Document Revised: 06/24/2016 Document Reviewed: 05/18/2016 °Elsevier Interactive Patient Education © 2017 [employer]vier Inc[employer] °Moderate Conscious Sedation, Adult, Care After °These instructions provide you with information about caring for yourself after your procedure. Your health care provider may also give you more specific instructions. Your treatment has been planned according to current medical practices, but problems sometimes occur. Call your health care provider if you have any problems or questions after your procedure. °What can I expect after the procedure? °After your procedure, it is common: °· To feel sleepy for several hours. °· To feel clumsy and have poor balance for several hours. °· To have poor judgment for several hours. °· To vomit if you eat too soon. °Follow these instructions at home: °For at least 24 hours after the procedure:  ° °· Do not: °¨ Participate in activities where you could fall or become injured. °¨ Drive. °¨ Use heavy machinery. °¨ Drink alcohol. °¨ Take sleeping pills or medicines that cause drowsiness. °¨ Make important decisions or sign legal documents. °¨ Take care of children on your own. °· Rest. °Eating and drinking  °· Follow the diet recommended by your health care provider. °· If you vomit: °¨ Drink water, juice, or soup when you can drink without vomiting. °¨ Make sure you have little or no nausea before eating solid foods. °General instructions  °· Have a responsible adult stay with you until you   are awake and alert. °· Take over-the-counter and prescription medicines only as told by your health care provider. °· If you smoke, do not smoke without supervision. °· Keep all follow-up visits as told by your health care provider. This is important. °Contact a health care provider if: °· You keep feeling nauseous or you keep vomiting. °· You feel light-headed. °· You develop a  rash. °· You have a fever. °Get help right away if: °· You have trouble breathing. °This information is not intended to replace advice given to you by your health care provider. Make sure you discuss any questions you have with your health care provider. °Document Released: 09/25/2013 Document Revised: 05/09/2016 Document Reviewed: 03/26/2016 °Elsevier Interactive Patient Education © 2017 Elsevier Inc. ° °

## 2017-02-27 ENCOUNTER — Ambulatory Visit (HOSPITAL_BASED_OUTPATIENT_CLINIC_OR_DEPARTMENT_OTHER): Payer: 59 | Admitting: Hematology

## 2017-02-27 ENCOUNTER — Telehealth: Payer: Self-pay | Admitting: Hematology

## 2017-02-27 ENCOUNTER — Other Ambulatory Visit (HOSPITAL_BASED_OUTPATIENT_CLINIC_OR_DEPARTMENT_OTHER): Payer: 59

## 2017-02-27 VITALS — BP 142/66 | HR 73 | Temp 98.4°F | Resp 18 | Ht 67.0 in | Wt 203.8 lb

## 2017-02-27 DIAGNOSIS — D472 Monoclonal gammopathy: Secondary | ICD-10-CM

## 2017-02-27 DIAGNOSIS — E559 Vitamin D deficiency, unspecified: Secondary | ICD-10-CM

## 2017-02-27 DIAGNOSIS — D500 Iron deficiency anemia secondary to blood loss (chronic): Secondary | ICD-10-CM

## 2017-02-27 DIAGNOSIS — C9000 Multiple myeloma not having achieved remission: Secondary | ICD-10-CM

## 2017-02-27 DIAGNOSIS — N920 Excessive and frequent menstruation with regular cycle: Secondary | ICD-10-CM | POA: Diagnosis not present

## 2017-02-27 DIAGNOSIS — D509 Iron deficiency anemia, unspecified: Secondary | ICD-10-CM

## 2017-02-27 LAB — COMPREHENSIVE METABOLIC PANEL
ALT: 50 U/L (ref 0–55)
ANION GAP: 8 meq/L (ref 3–11)
AST: 36 U/L — ABNORMAL HIGH (ref 5–34)
Albumin: 3.8 g/dL (ref 3.5–5.0)
Alkaline Phosphatase: 68 U/L (ref 40–150)
BILIRUBIN TOTAL: 0.25 mg/dL (ref 0.20–1.20)
BUN: 6.5 mg/dL — ABNORMAL LOW (ref 7.0–26.0)
CO2: 25 meq/L (ref 22–29)
Calcium: 8.8 mg/dL (ref 8.4–10.4)
Chloride: 107 mEq/L (ref 98–109)
Creatinine: 0.7 mg/dL (ref 0.6–1.1)
Glucose: 95 mg/dl (ref 70–140)
Potassium: 3.3 mEq/L — ABNORMAL LOW (ref 3.5–5.1)
Sodium: 140 mEq/L (ref 136–145)
TOTAL PROTEIN: 8.1 g/dL (ref 6.4–8.3)

## 2017-02-27 LAB — CBC & DIFF AND RETIC
BASO%: 0.7 % (ref 0.0–2.0)
Basophils Absolute: 0.1 10*3/uL (ref 0.0–0.1)
EOS%: 2.3 % (ref 0.0–7.0)
Eosinophils Absolute: 0.2 10*3/uL (ref 0.0–0.5)
HCT: 39.4 % (ref 34.8–46.6)
HGB: 12.8 g/dL (ref 11.6–15.9)
Immature Retic Fract: 7.6 % (ref 1.60–10.00)
LYMPH%: 32.1 % (ref 14.0–49.7)
MCH: 28.3 pg (ref 25.1–34.0)
MCHC: 32.5 g/dL (ref 31.5–36.0)
MCV: 87 fL (ref 79.5–101.0)
MONO#: 0.4 10*3/uL (ref 0.1–0.9)
MONO%: 5.5 % (ref 0.0–14.0)
NEUT#: 4.2 10*3/uL (ref 1.5–6.5)
NEUT%: 59.4 % (ref 38.4–76.8)
PLATELETS: 339 10*3/uL (ref 145–400)
RBC: 4.53 10*6/uL (ref 3.70–5.45)
RDW: 23.3 % — ABNORMAL HIGH (ref 11.2–14.5)
Retic %: 0.99 % (ref 0.70–2.10)
Retic Ct Abs: 44.85 10*3/uL (ref 33.70–90.70)
WBC: 7.1 10*3/uL (ref 3.9–10.3)
lymph#: 2.3 10*3/uL (ref 0.9–3.3)

## 2017-02-27 LAB — FERRITIN: Ferritin: 624 ng/ml — ABNORMAL HIGH (ref 9–269)

## 2017-02-27 LAB — CHROMOSOME ANALYSIS, BONE MARROW

## 2017-02-27 MED ORDER — POLYSACCHARIDE IRON COMPLEX 150 MG PO CAPS: 150.0000 mg | ORAL_CAPSULE | Freq: Every day | ORAL | 2 refills | Status: DC

## 2017-02-27 NOTE — Telephone Encounter (Signed)
Gave patient AVS and calender per 02/27/2017 los

## 2017-03-01 LAB — TISSUE HYBRIDIZATION (BONE MARROW)-NCBH

## 2017-03-06 ENCOUNTER — Encounter (HOSPITAL_COMMUNITY): Payer: Self-pay

## 2017-03-06 DIAGNOSIS — E559 Vitamin D deficiency, unspecified: Secondary | ICD-10-CM | POA: Diagnosis not present

## 2017-03-06 DIAGNOSIS — E611 Iron deficiency: Secondary | ICD-10-CM | POA: Diagnosis not present

## 2017-03-07 ENCOUNTER — Encounter (HOSPITAL_COMMUNITY): Payer: Self-pay

## 2017-03-14 DIAGNOSIS — D649 Anemia, unspecified: Secondary | ICD-10-CM | POA: Diagnosis not present

## 2017-03-14 DIAGNOSIS — D472 Monoclonal gammopathy: Secondary | ICD-10-CM | POA: Diagnosis not present

## 2017-04-21 NOTE — Progress Notes (Signed)
Shirley Griffin    HEMATOLOGY/ONCOLOGY CLINIC NOTE  Date of Service: .02/27/2017  Patient Care Team: [name], MD as PCP - General (Internal Medicine)  CHIEF COMPLAINTS/PURPOSE OF CONSULTATION:  Anemia and  MGUS  HISTORY OF PRESENTING ILLNESS:   Shirley Griffin is a wonderful 46 y.o. female who has been referred to [place] by Dr .[name], MD for evaluation and management of Anemia and M spike.  Patient was admitted to the GYN in mid January 2018 for severe menorrhagia thought to be related to dysfunctional uterine bleeding and fibroids. Her hemoglobin was down to 6.1 with an MCV of 61 normal WBC count of 9.3k elevated platelets of 606k.  Patient received PRBC transfusions For her severe symptomatic anemia. Posttransfusion hemoglobin was 9.5. She has also been started on oral iron and has been taking ferrous sulfate 1 tablet by mouth twice a day with vitamin C. This has bothered her with significant constipation since she has also had chronic constipation for which she is on Linzess.   Patient had a D&C and endometrial ablation to control her severe menorrhagia on 12/30/2016   patient had additional anemia workup with her primary care physician which included SPEP which showed an M spike of 1.8 g/dL. UPEP showed no M spike. Folic acid within normal limits. Celiac disease panel was done which was unrevealing.  Given persistent severe iron deficiency despite oral iron replacement, borderline tolerance and monoclonal paraproteinemia the patient was referred to [place] for further evaluation and management.   Patient notes no focal bone pains. Her labs with her primary care physician showed no renal insufficiency or hypercalcemia. TSH was within normal limits.  Patient notes no overt GI bleeding or other blood loss other than severe menorrhagia.patient notes that she's had colonoscopy In the last year which was noted to be negative. Patient notes that her menorrhagia has reduced after her D&C and endometrial  ablation.  INTERVAL HISTORY  [name] is here for her scheduled followup for her iron deficiency anemia and MGUS. She tolerated her IV Injectafer well without any overt toxicities. Her hemoglobin has completely normalized to 12.8 and she notes that she is feeling much better. Given her significant abnormality in serum free light chains and M spike 1.9 g/dL she had a CT-guided bone marrow biopsy on 02/20/2017 which showed possibility of plasma cell neoplasm. Results of bone marrow were discussed in details with her and a detailed plan of care was developed.   MEDICAL HISTORY:  Past Medical History:  Diagnosis Date  . Anemia associated with acute blood loss    11-28-2016 symptomatic  HG 6.2  transfused x1 11-29-2016  . H/O varicella   . Headache   . History of bacterial infection   . History of gestational diabetes mellitus, not pregnant   . History of gestational hypertension   . History of trichomoniasis   Hypertension Gestational diabetes Eczema Allergic rhinitis  menorrhagia related to dysfunctional uterine bleeding and fibroids  Chronic iron deficiency  Vitamin D deficiency  Chronic constipation on Linzess.  SURGICAL HISTORY: Past Surgical History:  Procedure Laterality Date  . CESAREAN SECTION  06/23/2001  . CESAREAN SECTION W/BTL  08/25/2007  . DILITATION & CURRETTAGE/HYSTROSCOPY WITH HYDROTHERMAL ABLATION N/A 12/30/2016   Procedure: DILATATION & CURETTAGE/HYSTEROSCOPY WITH HYDROTHERMAL ABLATION;  Surgeon: Eldred Manges, MD;  Location: [facility];  Service: Gynecology;  Laterality: N/A;  . WISDOM TOOTH EXTRACTION      SOCIAL HISTORY: Social History   Social History  . Marital status: Married  Spouse name: N/A  . Number of children: N/A  . Years of education: N/A   Occupational History  . Not on file.   Social History Main Topics  . Smoking status: Never Smoker  . Smokeless tobacco: Never Used  . Alcohol use Yes  . Drug use: No  . Sexual activity: Yes     Partners: Male    Birth control/ protection: Surgical     Comment: tubal ligation   Other Topics Concern  . Not on file   Social History Narrative  . No narrative on file    FAMILY HISTORY: Family History  Problem Relation Age of Onset  . Cancer Paternal Grandfather   . Hypertension Paternal Grandfather   . Hypertension Paternal Grandmother   . Hypertension Maternal Grandmother   . Heart attack Maternal Grandfather   . Hypertension Father   . Diabetes Mother     ALLERGIES:  is allergic to floxin [ofloxacin].  MEDICATIONS:  Current Outpatient Prescriptions  Medication Sig Dispense Refill  . b complex vitamins capsule Take 1 capsule by mouth daily. 30 capsule 3  . clobetasol cream (TEMOVATE) 0.35 % Apply 1 application topically daily as needed (eczema).     . ergocalciferol (VITAMIN D2) 50000 units capsule Take 1 capsule (50,000 Units total) by mouth once a week. 12 capsule 0  . EUCRISA 2 % OINT Apply 1 application topically daily as needed (eczema).   0  . Hydrocodone-Acetaminophen 5-300 MG TABS Take 1 tablet by mouth every 4 (four) hours as needed. 12 each 0  . ibuprofen (ADVIL,MOTRIN) 600 MG tablet Ibuprofen 600 mg by mouth every 6 hours for 3 days, then every 6 hours as needed for pain 60 tablet 2  . iron polysaccharides (NIFEREX) 150 MG capsule Take 1 capsule (150 mg total) by mouth daily. 30 capsule 2   No current facility-administered medications for this visit.     REVIEW OF SYSTEMS:    10 Point review of Systems was done is negative except as noted above.  PHYSICAL EXAMINATION: ECOG PERFORMANCE STATUS: 1 - Symptomatic but completely ambulatory  . Vitals:   02/27/17 1436  BP: (!) 142/66  Pulse: 73  Resp: 18  Temp: 98.4 F (36.9 C)   Filed Weights   02/27/17 1436  Weight: 203 lb 12.8 oz (92.4 kg)   .Body mass index is 31.92 kg/m.  GENERAL:alert, in no acute distress and comfortable SKIN: skin color, texture, turgor are normal, no rashes or  significant lesions EYES: normal, conjunctiva are pink and non-injected, sclera clear OROPHARYNX:no exudate, no erythema and lips, buccal mucosa, and tongue normal  NECK: supple, no JVD, thyroid normal size, non-tender, without nodularity LYMPH:  no palpable lymphadenopathy in the cervical, axillary or inguinal LUNGS: clear to auscultation with normal respiratory effort HEART: regular rate & rhythm,  no murmurs and no lower extremity edema ABDOMEN: abdomen soft, non-tender, normoactive bowel sounds  no palpable hepatosplenomegaly  Musculoskeletal: no tenderness to palpation over the spine PSYCH: alert & oriented x 3 with fluent speech NEURO: no focal motor/sensory deficits  LABORATORY DATA:  I have reviewed the data as listed  . CBC Latest Ref Rng & Units 02/27/2017 02/20/2017 01/23/2017  WBC 3.9 - 10.3 10e3/uL 7.1 6.6 7.3  Hemoglobin 11.6 - 15.9 g/dL 12.8 12.6 11.0(L)  Hematocrit 34.8 - 46.6 % 39.4 38.5 36.0  Platelets 145 - 400 10e3/uL 339 357 454(H)   . CBC    Component Value Date/Time   WBC 7.1 02/27/2017 1415   WBC  6.6 02/20/2017 0718   RBC 4.53 02/27/2017 1415   RBC 4.59 02/20/2017 0718   HGB 12.8 02/27/2017 1415   HCT 39.4 02/27/2017 1415   PLT 339 02/27/2017 1415   MCV 87.0 02/27/2017 1415   MCH 28.3 02/27/2017 1415   MCH 27.5 02/20/2017 0718   MCHC 32.5 02/27/2017 1415   MCHC 32.7 02/20/2017 0718   RDW 23.3 (H) 02/27/2017 1415   LYMPHSABS 2.3 02/27/2017 1415   MONOABS 0.4 02/27/2017 1415   EOSABS 0.2 02/27/2017 1415   BASOSABS 0.1 02/27/2017 1415    . CMP Latest Ref Rng & Units 02/27/2017 01/23/2017 01/23/2017  Glucose 70 - 140 mg/dl 95 99 -  BUN 7.0 - 26.0 mg/dL 6.5(L) 7.5 -  Creatinine 0.6 - 1.1 mg/dL 0.7 0.8 -  Sodium 136 - 145 mEq/L 140 135(L) -  Potassium 3.5 - 5.1 mEq/L 3.3(L) 3.6 -  Chloride 101 - 111 mmol/L - - -  CO2 22 - 29 mEq/L 25 24 -  Calcium 8.4 - 10.4 mg/dL 8.8 9.6 -  Total Protein 6.4 - 8.3 g/dL 8.1 8.9(H) 7.9  Total Bilirubin 0.20 - 1.20 mg/dL  0.25 0.28 -  Alkaline Phos 40 - 150 U/L 68 58 -  AST 5 - 34 U/L 36(H) 20 -  ALT 0 - 55 U/L 50 19 -    Lab Results  Component Value Date   FERRITIN 624 (H) 02/27/2017   Component     Latest Ref Rng & Units 01/23/2017  Folate, Hemolysate     Not Estab. ng/mL 456.7  HCT     34.0 - 46.6 % 35.3  Folate, RBC     >498 ng/mL 1,294  Vitamin B12     232 - 1,245 pg/mL 343  Folate     >3.0 ng/mL 18.3  Sed Rate     0 - 32 mm/hr 31  LDH     125 - 245 U/L 145  Beta 2     0.6 - 2.4 mg/L 1.3             RADIOGRAPHIC STUDIES: I have personally reviewed the radiological images as listed and agreed with the findings in the report.  Whole body skeletal survey    ASSESSMENT & PLAN:   46 year old female with   #1 Severe microcytic anemia due to iron deficiency. #2 severe iron deficiency likely related to severe menorrhagia from dysfunctional uterine bleeding and uterine fibroids.  Patient had dropped her hemoglobin to 6.1 requiring PRBC transfusions . She notes that her menorrhagia has significantly improved after her D&C and endometrial ablation on 12/30/2016 . Patient notes she is recently had a colonoscopy which was negative . No overt evidence of GI bleeding or other blood loss  #3 poor  tolerance to oral iron due to significant constipation. This is complicating her chronic constipation that has required treatment with linzess Plan  -Patient's anemia has completely resolved with IV iron replacement and at this time does not seem to be related to her plasma cell neoplasm. -Ferritin levels are adequate and that is no indication for need for additional IV iron at this time. -Reasonable to take a daily multivitamin to support accelerated hematopoiesis.  #4 IgG lambda smoldering myeloma  Patient has an M spike of 1.9 g/dL Elevated lambda free light chains with a significantly abnormal  kappa Lambda ratio UPEP with primary care physician was negative for monoclonal protein. No  hypercalcemia and no renal insufficiency no bone pains. Whole body skeletal survey shows no  overt lytic lesions in the visualized skeleton. Anemia does not appear to be related to her paraproteinemia currently. Bone marrow examination showed 10% lambda restricted plasma cells consistent with plasma cell neoplasm. Plan -No indication of crab criteria with the patient's smoldering myeloma at this time. -The patient's bone marrow biopsies were discussed in details with her and all her questions were answered. -We discussed in detail the diagnosis, natural history, risk of progression and indications for treatment off smoldering multiple myeloma. -We discussed that this will need closer follow-up than MGUS alone.  -#5 Vit D deficiency -started on Ergocalciferol 50k units weekly x 12 doses -continue f/u with PCP  Return to care with Dr. Irene Limbo in 3 months with repeat labs.  All of the patients questions were answered with apparent satisfaction. The patient knows to call the clinic with any problems, questions or concerns.  I spent 30 minutes counseling the patient face to face. The total time spent in the appointment was 40 minutes and more than 50% was on counseling and direct patient cares.    Sullivan Lone MD [place] [facility] Hematology/Oncology Physician [facility]  (Office):       [phone] (Work cell):  [phone] (Fax):           [phone]

## 2017-06-06 ENCOUNTER — Encounter: Payer: Self-pay | Admitting: Hematology

## 2017-06-06 ENCOUNTER — Other Ambulatory Visit (HOSPITAL_BASED_OUTPATIENT_CLINIC_OR_DEPARTMENT_OTHER): Payer: 59

## 2017-06-06 ENCOUNTER — Ambulatory Visit (HOSPITAL_BASED_OUTPATIENT_CLINIC_OR_DEPARTMENT_OTHER): Payer: 59 | Admitting: Hematology

## 2017-06-06 ENCOUNTER — Telehealth: Payer: Self-pay | Admitting: Hematology

## 2017-06-06 VITALS — BP 129/85 | HR 74 | Temp 98.7°F | Resp 18 | Ht 67.0 in | Wt 205.6 lb

## 2017-06-06 DIAGNOSIS — E559 Vitamin D deficiency, unspecified: Secondary | ICD-10-CM | POA: Diagnosis not present

## 2017-06-06 DIAGNOSIS — D500 Iron deficiency anemia secondary to blood loss (chronic): Secondary | ICD-10-CM

## 2017-06-06 DIAGNOSIS — D509 Iron deficiency anemia, unspecified: Secondary | ICD-10-CM

## 2017-06-06 DIAGNOSIS — D472 Monoclonal gammopathy: Secondary | ICD-10-CM

## 2017-06-06 DIAGNOSIS — C9000 Multiple myeloma not having achieved remission: Secondary | ICD-10-CM

## 2017-06-06 LAB — CBC & DIFF AND RETIC
BASO%: 0.4 % (ref 0.0–2.0)
Basophils Absolute: 0 10*3/uL (ref 0.0–0.1)
EOS%: 1.7 % (ref 0.0–7.0)
Eosinophils Absolute: 0.1 10*3/uL (ref 0.0–0.5)
HCT: 39.4 % (ref 34.8–46.6)
HGB: 13.1 g/dL (ref 11.6–15.9)
IMMATURE RETIC FRACT: 8 % (ref 1.60–10.00)
LYMPH#: 2.1 10*3/uL (ref 0.9–3.3)
LYMPH%: 28.6 % (ref 14.0–49.7)
MCH: 32 pg (ref 25.1–34.0)
MCHC: 33.2 g/dL (ref 31.5–36.0)
MCV: 96.3 fL (ref 79.5–101.0)
MONO#: 0.4 10*3/uL (ref 0.1–0.9)
MONO%: 5.2 % (ref 0.0–14.0)
NEUT%: 64.1 % (ref 38.4–76.8)
NEUTROS ABS: 4.8 10*3/uL (ref 1.5–6.5)
PLATELETS: 338 10*3/uL (ref 145–400)
RBC: 4.09 10*6/uL (ref 3.70–5.45)
RDW: 12.7 % (ref 11.2–14.5)
RETIC %: 1.45 % (ref 0.70–2.10)
RETIC CT ABS: 59.31 10*3/uL (ref 33.70–90.70)
WBC: 7.5 10*3/uL (ref 3.9–10.3)

## 2017-06-06 LAB — IRON AND TIBC
%SAT: 40 % (ref 21–57)
Iron: 123 ug/dL (ref 41–142)
TIBC: 310 ug/dL (ref 236–444)
UIBC: 187 ug/dL (ref 120–384)

## 2017-06-06 LAB — COMPREHENSIVE METABOLIC PANEL
ALT: 26 U/L (ref 0–55)
AST: 21 U/L (ref 5–34)
Albumin: 3.8 g/dL (ref 3.5–5.0)
Alkaline Phosphatase: 48 U/L (ref 40–150)
Anion Gap: 8 mEq/L (ref 3–11)
BILIRUBIN TOTAL: 0.42 mg/dL (ref 0.20–1.20)
BUN: 6.7 mg/dL — ABNORMAL LOW (ref 7.0–26.0)
CHLORIDE: 104 meq/L (ref 98–109)
CO2: 26 meq/L (ref 22–29)
CREATININE: 0.7 mg/dL (ref 0.6–1.1)
Calcium: 9.7 mg/dL (ref 8.4–10.4)
EGFR: >90 mL/min/{1.73_m2} (ref >90)
GLUCOSE: 98 mg/dL (ref 70–140)
Potassium: 3.5 mEq/L (ref 3.5–5.1)
SODIUM: 139 meq/L (ref 136–145)
TOTAL PROTEIN: 8 g/dL (ref 6.4–8.3)

## 2017-06-06 LAB — FERRITIN: Ferritin: 230 ng/ml (ref 9–269)

## 2017-06-06 NOTE — Telephone Encounter (Signed)
Scheduled appt per 6/19 los - Gave patient AVS and calender per LOS .  

## 2017-06-06 NOTE — Patient Instructions (Signed)
Thank you for choosing [facility] to provide your oncology and hematology care.  To afford each patient quality time with our providers, please arrive 30 minutes before your scheduled appointment time.  If you arrive late for your appointment, you may be asked to reschedule.  We strive to give you quality time with our providers, and arriving late affects you and other patients whose appointments are after yours.  If you are a no show for multiple scheduled visits, you may be dismissed from the clinic at the providers discretion.   Again, thank you for choosing [facility], our hope is that these requests will decrease the amount of time that you wait before being seen by our physicians.  ______________________________________________________________________ Should you have questions after your visit to the [facility], please contact our office at [phone] between the hours of 8:30 and 4:30 p.m.    Voicemails left after 4:30p.m will not be returned until the following business day.   For prescription refill requests, please have your pharmacy contact us directly.  Please also try to allow 48 hours for prescription requests.   Please contact the scheduling department for questions regarding scheduling.  For scheduling of procedures such as PET scans, CT scans, MRI, Ultrasound, etc please contact central scheduling at [phone].   Resources For Cancer Patients and Caregivers:  American Cancer Society:  800-227-2345  Can help patients locate various types of support and financial assistance Cancer Care: 1-800-813-HOPE (4673) Provides financial assistance, online support groups, medication/co-pay assistance.   [place] DSS:  336-641-3447 Where to apply for food stamps, Medicaid, and utility assistance Medicare Rights Center: 800-333-4114 Helps people with Medicare understand their rights and benefits, navigate the Medicare system, and secure the  quality healthcare they deserve SCAT: 336-333-6589 [employer]'s shared-ride transportation service for eligible riders who have a disability that prevents them from riding the fixed route bus.   For additional information on assistance programs please contact our social worker:   Grier Hock/Abigail Elmore:  [phone] 

## 2017-06-06 NOTE — Progress Notes (Signed)
[name]    HEMATOLOGY/ONCOLOGY CLINIC NOTE  Date of Service: .06/06/2017  Patient Care Team: [name], MD as PCP - General (Internal Medicine)  CHIEF COMPLAINTS/PURPOSE OF CONSULTATION:  Iron deficiency Anemia and  MGUS  HISTORY OF PRESENTING ILLNESS:   Shirley Griffin is a wonderful 46 y.o. female who has been referred to [place] by Dr .[name], MD for evaluation and management of Anemia and M spike.  Patient was admitted to the GYN in mid January 2018 for severe menorrhagia thought to be related to dysfunctional uterine bleeding and fibroids. Her hemoglobin was down to 6.1 with an MCV of 61 normal WBC count of 9.3k elevated platelets of 606k.  Patient received PRBC transfusions For her severe symptomatic anemia. Posttransfusion hemoglobin was 9.5. She has also been started on oral iron and has been taking ferrous sulfate 1 tablet by mouth twice a day with vitamin C. This has bothered her with significant constipation since she has also had chronic constipation for which she is on Linzess.   Patient had a D&C and endometrial ablation to control her severe menorrhagia on 12/30/2016   patient had additional anemia workup with her primary care physician which included SPEP which showed an M spike of 1.8 g/dL. UPEP showed no M spike. Folic acid within normal limits. Celiac disease panel was done which was unrevealing.  Given persistent severe iron deficiency despite oral iron replacement, borderline tolerance and monoclonal paraproteinemia the patient was referred to [place] for further evaluation and management.   Patient notes no focal bone pains. Her labs with her primary care physician showed no renal insufficiency or hypercalcemia. TSH was within normal limits.  Patient notes no overt GI bleeding or other blood loss other than severe menorrhagia.patient notes that she's had colonoscopy In the last year which was noted to be negative. Patient notes that her menorrhagia has reduced after her D&C  and endometrial ablation.  INTERVAL HISTORY  Shirley Griffin is here for her scheduled followup for her iron deficiency anemia and MGUS. She notes that she is feeling the best she has felt in for a while. Her anemia has resolved and her hemoglobin today is up to 13.1. No new bone pains. No new fatigue. She knows that her menstrual losses are minimal at this time and have slowed down significantly since her endometrial ablation in January of this year. No other acute new concerns.  MEDICAL HISTORY:  Past Medical History:  Diagnosis Date  . Anemia associated with acute blood loss    11-28-2016 symptomatic  HG 6.2  transfused x1 11-29-2016  . H/O varicella   . Headache   . History of bacterial infection   . History of gestational diabetes mellitus, not pregnant   . History of gestational hypertension   . History of trichomoniasis   Hypertension Gestational diabetes Eczema Allergic rhinitis  menorrhagia related to dysfunctional uterine bleeding and fibroids  Chronic iron deficiency  Vitamin D deficiency  Chronic constipation on Linzess.  SURGICAL HISTORY: Past Surgical History:  Procedure Laterality Date  . CESAREAN SECTION  06/23/2001  . CESAREAN SECTION W/BTL  08/25/2007  . DILITATION & CURRETTAGE/HYSTROSCOPY WITH HYDROTHERMAL ABLATION N/A 12/30/2016   Procedure: DILATATION & CURETTAGE/HYSTEROSCOPY WITH HYDROTHERMAL ABLATION;  Surgeon: Eldred Manges, MD;  Location: [facility];  Service: Gynecology;  Laterality: N/A;  . WISDOM TOOTH EXTRACTION      SOCIAL HISTORY: Social History   Social History  . Marital status: Married    Spouse name: N/A  . Number of children:  N/A  . Years of education: N/A   Occupational History  . Not on file.   Social History Main Topics  . Smoking status: Never Smoker  . Smokeless tobacco: Never Used  . Alcohol use Yes  . Drug use: No  . Sexual activity: Yes    Partners: Male    Birth control/ protection: Surgical     Comment: tubal ligation     Other Topics Concern  . Not on file   Social History Narrative  . No narrative on file    FAMILY HISTORY: Family History  Problem Relation Age of Onset  . Cancer Paternal Grandfather   . Hypertension Paternal Grandfather   . Hypertension Paternal Grandmother   . Hypertension Maternal Grandmother   . Heart attack Maternal Grandfather   . Hypertension Father   . Diabetes Mother     ALLERGIES:  is allergic to floxin [ofloxacin].  MEDICATIONS:  Current Outpatient Prescriptions  Medication Sig Dispense Refill  . b complex vitamins capsule Take 1 capsule by mouth daily. 30 capsule 3  . clobetasol cream (TEMOVATE) 9.83 % Apply 1 application topically daily as needed (eczema).     . ergocalciferol (VITAMIN D2) 50000 units capsule Take 1 capsule (50,000 Units total) by mouth once a week. 12 capsule 0  . EUCRISA 2 % OINT Apply 1 application topically daily as needed (eczema).   0  . ibuprofen (ADVIL,MOTRIN) 600 MG tablet Ibuprofen 600 mg by mouth every 6 hours for 3 days, then every 6 hours as needed for pain 60 tablet 2   No current facility-administered medications for this visit.     REVIEW OF SYSTEMS:    10 Point review of Systems was done is negative except as noted above.  PHYSICAL EXAMINATION: ECOG PERFORMANCE STATUS: 1 - Symptomatic but completely ambulatory  . Vitals:   06/06/17 1252  BP: 129/85  Pulse: 74  Resp: 18  Temp: 98.7 F (37.1 C)   Filed Weights   06/06/17 1252  Weight: 205 lb 9.6 oz (93.3 kg)   .Body mass index is 32.2 kg/m.  GENERAL:alert, in no acute distress and comfortable SKIN: skin color, texture, turgor are normal, no rashes or significant lesions EYES: normal, conjunctiva are pink and non-injected, sclera clear OROPHARYNX:no exudate, no erythema and lips, buccal mucosa, and tongue normal  NECK: supple, no JVD, thyroid normal size, non-tender, without nodularity LYMPH:  no palpable lymphadenopathy in the cervical, axillary or  inguinal LUNGS: clear to auscultation with normal respiratory effort HEART: regular rate & rhythm,  no murmurs and no lower extremity edema ABDOMEN: abdomen soft, non-tender, normoactive bowel sounds  no palpable hepatosplenomegaly  Musculoskeletal: no tenderness to palpation over the spine PSYCH: alert & oriented x 3 with fluent speech NEURO: no focal motor/sensory deficits  LABORATORY DATA:  I have reviewed the data as listed  . CBC Latest Ref Rng & Units 06/06/2017 02/27/2017 02/20/2017  WBC 3.9 - 10.3 10e3/uL 7.5 7.1 6.6  Hemoglobin 11.6 - 15.9 g/dL 13.1 12.8 12.6  Hematocrit 34.8 - 46.6 % 39.4 39.4 38.5  Platelets 145 - 400 10e3/uL 338 339 357   . CBC    Component Value Date/Time   WBC 7.5 06/06/2017 1234   WBC 6.6 02/20/2017 0718   RBC 4.09 06/06/2017 1234   RBC 4.59 02/20/2017 0718   HGB 13.1 06/06/2017 1234   HCT 39.4 06/06/2017 1234   PLT 338 06/06/2017 1234   MCV 96.3 06/06/2017 1234   MCH 32.0 06/06/2017 1234   MCH  27.5 02/20/2017 0718   MCHC 33.2 06/06/2017 1234   MCHC 32.7 02/20/2017 0718   RDW 12.7 06/06/2017 1234   LYMPHSABS 2.1 06/06/2017 1234   MONOABS 0.4 06/06/2017 1234   EOSABS 0.1 06/06/2017 1234   BASOSABS 0.0 06/06/2017 1234    . CMP Latest Ref Rng & Units 02/27/2017 01/23/2017 01/23/2017  Glucose 70 - 140 mg/dl 95 99 -  BUN 7.0 - 26.0 mg/dL 6.5(L) 7.5 -  Creatinine 0.6 - 1.1 mg/dL 0.7 0.8 -  Sodium 136 - 145 mEq/L 140 135(L) -  Potassium 3.5 - 5.1 mEq/L 3.3(L) 3.6 -  Chloride 101 - 111 mmol/L - - -  CO2 22 - 29 mEq/L 25 24 -  Calcium 8.4 - 10.4 mg/dL 8.8 9.6 -  Total Protein 6.4 - 8.3 g/dL 8.1 8.9(H) 7.9  Total Bilirubin 0.20 - 1.20 mg/dL 0.25 0.28 -  Alkaline Phos 40 - 150 U/L 68 58 -  AST 5 - 34 U/L 36(H) 20 -  ALT 0 - 55 U/L 50 19 -    Lab Results  Component Value Date   FERRITIN 624 (H) 02/27/2017   Component     Latest Ref Rng & Units 01/23/2017  Folate, Hemolysate     Not Estab. ng/mL 456.7  HCT     34.0 - 46.6 % 35.3  Folate,  RBC     >498 ng/mL 1,294  Vitamin B12     232 - 1,245 pg/mL 343  Folate     >3.0 ng/mL 18.3  Sed Rate     0 - 32 mm/hr 31  LDH     125 - 245 U/L 145  Beta 2     0.6 - 2.4 mg/L 1.3             RADIOGRAPHIC STUDIES: I have personally reviewed the radiological images as listed and agreed with the findings in the report.  Whole body skeletal survey    ASSESSMENT & PLAN:   46 year old female with   #1 s/p Severe microcytic anemia due to iron deficiency.  Anemia has resolved with IV Iron - gb today is WNL at 13.1 #2 severe iron deficiency likely related to severe menorrhagia from dysfunctional uterine bleeding and uterine fibroids.  Patient had dropped her hemoglobin to 6.1 requiring PRBC transfusions . She notes that her menorrhagia has significantly improved after her D&C and endometrial ablation on 12/30/2016 . Patient notes she is recently had a colonoscopy which was negative . No overt evidence of GI bleeding or other blood loss  #3 poor  tolerance to oral iron due to significant constipation. This is complicating her chronic constipation that has required treatment with linzess Plan  -Patient's anemia has completely resolved with IV iron replacement and at this time does not seem to be related to her plasma cell neoplasm. -No need for additional IV iron at this time. We will follow up on the pending ferritin levels from today.  #4 IgG lambda smoldering myeloma  Patient has an M spike of 1.9 g/dL Elevated lambda free light chains with a significantly abnormal  kappa Lambda ratio UPEP with primary care physician was negative for monoclonal protein. No hypercalcemia and no renal insufficiency no bone pains. Whole body skeletal survey shows no overt lytic lesions in the visualized skeleton. Anemia does not appear to be related to her paraproteinemia currently. Bone marrow examination showed 10% lambda restricted plasma cells consistent with plasma cell  neoplasm. Plan -No clinical indication progression of the patient's smoldering multiple  myeloma at this time. - We shall follow up on our pending myeloma labs, serum free light chains and CMP. -We shall plan to see her back in 4 months with repeat labs unless any new concerns arise on the pending labs.  -#5 Vit D deficiency -started on Ergocalciferol 50k units weekly x 12 doses -continue f/u with PCP  Return to care with Dr. Irene Limbo in 4 months with repeat labs. Patient recommended to do labs 1 week prior to follow-up to have results available during clinic visit.  All of the patients questions were answered with apparent satisfaction. The patient knows to call the clinic with any problems, questions or concerns.  I spent 15 minutes counseling the patient face to face. The total time spent in the appointment was 20 minutes and more than 50% was on counseling and direct patient cares.    Sullivan Lone MD [place] AAHIVMS [facility] Hematology/Oncology Physician [facility]  (Office):       [phone] (Work cell):  [phone] (Fax):           [phone]

## 2017-06-07 LAB — KAPPA/LAMBDA LIGHT CHAINS
IG KAPPA FREE LIGHT CHAIN: 10.5 mg/L (ref 3.3–19.4)
Ig Lambda Free Light Chain: 103.4 mg/L — ABNORMAL HIGH (ref 5.7–26.3)
Kappa/Lambda FluidC Ratio: 0.1 — ABNORMAL LOW (ref 0.26–1.65)

## 2017-08-29 DIAGNOSIS — M79672 Pain in left foot: Secondary | ICD-10-CM | POA: Diagnosis not present

## 2017-08-29 DIAGNOSIS — S99922A Unspecified injury of left foot, initial encounter: Secondary | ICD-10-CM | POA: Diagnosis not present

## 2017-09-29 ENCOUNTER — Other Ambulatory Visit (HOSPITAL_BASED_OUTPATIENT_CLINIC_OR_DEPARTMENT_OTHER): Payer: 59

## 2017-09-29 DIAGNOSIS — D472 Monoclonal gammopathy: Secondary | ICD-10-CM

## 2017-09-29 DIAGNOSIS — D500 Iron deficiency anemia secondary to blood loss (chronic): Secondary | ICD-10-CM

## 2017-09-29 DIAGNOSIS — D509 Iron deficiency anemia, unspecified: Secondary | ICD-10-CM | POA: Diagnosis not present

## 2017-09-29 DIAGNOSIS — C9000 Multiple myeloma not having achieved remission: Secondary | ICD-10-CM | POA: Diagnosis not present

## 2017-09-29 LAB — COMPREHENSIVE METABOLIC PANEL
ALBUMIN: 3.9 g/dL (ref 3.5–5.0)
ALK PHOS: 56 U/L (ref 40–150)
ALT: 19 U/L (ref 0–55)
ANION GAP: 9 meq/L (ref 3–11)
AST: 20 U/L (ref 5–34)
BILIRUBIN TOTAL: 0.33 mg/dL (ref 0.20–1.20)
BUN: 9.7 mg/dL (ref 7.0–26.0)
CALCIUM: 9.8 mg/dL (ref 8.4–10.4)
CO2: 27 mEq/L (ref 22–29)
Chloride: 103 mEq/L (ref 98–109)
Creatinine: 0.8 mg/dL (ref 0.6–1.1)
EGFR: >60 mL/min/{1.73_m2} (ref >60)
Glucose: 90 mg/dl (ref 70–140)
Potassium: 3.6 mEq/L (ref 3.5–5.1)
Sodium: 138 mEq/L (ref 136–145)
TOTAL PROTEIN: 8.2 g/dL (ref 6.4–8.3)

## 2017-09-29 LAB — CBC & DIFF AND RETIC
BASO%: 0.6 % (ref 0.0–2.0)
Basophils Absolute: 0.1 10*3/uL (ref 0.0–0.1)
EOS ABS: 0.2 10*3/uL (ref 0.0–0.5)
EOS%: 2.2 % (ref 0.0–7.0)
HCT: 42 % (ref 34.8–46.6)
HEMOGLOBIN: 14.2 g/dL (ref 11.6–15.9)
IMMATURE RETIC FRACT: 4.2 % (ref 1.60–10.00)
LYMPH%: 32.5 % (ref 14.0–49.7)
MCH: 32.3 pg (ref 25.1–34.0)
MCHC: 33.8 g/dL (ref 31.5–36.0)
MCV: 95.5 fL (ref 79.5–101.0)
MONO#: 0.5 10*3/uL (ref 0.1–0.9)
MONO%: 5.5 % (ref 0.0–14.0)
NEUT#: 5.3 10*3/uL (ref 1.5–6.5)
NEUT%: 59.2 % (ref 38.4–76.8)
PLATELETS: 363 10*3/uL (ref 145–400)
RBC: 4.4 10*6/uL (ref 3.70–5.45)
RDW: 13 % (ref 11.2–14.5)
Retic %: 1 % (ref 0.70–2.10)
Retic Ct Abs: 44 10*3/uL (ref 33.70–90.70)
WBC: 9 10*3/uL (ref 3.9–10.3)
lymph#: 2.9 10*3/uL (ref 0.9–3.3)

## 2017-10-02 LAB — KAPPA/LAMBDA LIGHT CHAINS
IG KAPPA FREE LIGHT CHAIN: 12.2 mg/L (ref 3.3–19.4)
IG LAMBDA FREE LIGHT CHAIN: 93.7 mg/L — AB (ref 5.7–26.3)
KAPPA/LAMBDA FLC RATIO: 0.13 — AB (ref 0.26–1.65)

## 2017-10-02 LAB — FERRITIN: FERRITIN: 196 ng/mL (ref 9–269)

## 2017-10-02 NOTE — Progress Notes (Signed)
[name]    HEMATOLOGY/ONCOLOGY CLINIC NOTE  Date of Service: 10/06/2017   Patient Care Team: [name], MD as PCP - General (Internal Medicine)  CHIEF COMPLAINTS/PURPOSE OF CONSULTATION:  Iron deficiency Anemia and  Smoldering Myeloma  HISTORY OF PRESENTING ILLNESS:   Shirley Griffin is a wonderful 46 y.o. female who has been referred to [place] by Dr .[name], MD for evaluation and management of Anemia and M spike.  Patient was admitted to the GYN in mid January 2018 for severe menorrhagia thought to be related to dysfunctional uterine bleeding and fibroids. Her hemoglobin was down to 6.1 with an MCV of 61 normal WBC count of 9.3k elevated platelets of 606k.  Patient received PRBC transfusions For her severe symptomatic anemia. Posttransfusion hemoglobin was 9.5. She has also been started on oral iron and has been taking ferrous sulfate 1 tablet by mouth twice a day with vitamin C. This has bothered her with significant constipation since she has also had chronic constipation for which she is on Linzess.   Patient had a D&C and endometrial ablation to control her severe menorrhagia on 12/30/2016   patient had additional anemia workup with her primary care physician which included SPEP which showed an M spike of 1.8 g/dL. UPEP showed no M spike. Folic acid within normal limits. Celiac disease panel was done which was unrevealing.  Given persistent severe iron deficiency despite oral iron replacement, borderline tolerance and monoclonal paraproteinemia the patient was referred to [place] for further evaluation and management.   Patient notes no focal bone pains. Her labs with her primary care physician showed no renal insufficiency or hypercalcemia. TSH was within normal limits.  Patient notes no overt GI bleeding or other blood loss other than severe menorrhagia.patient notes that she's had colonoscopy In the last year which was noted to be negative. Patient notes that her menorrhagia has reduced  after her D&C and endometrial ablation.  INTERVAL HISTORY   [name] is here for her scheduled followup for her iron deficiency anemia and smoldering myeloma. She notes she is doing well overall. SHer anemia has resolved with hgb now of 14.2. She notes to have stopped eating ice which she thinks has helped. After her D&C she has regular period cycles but no more menorrhagia. She denies taking NSAIDs or antihistamines, but her BP was elevated slightly today. She will take a baseline BP at home. She reports to spraining her left ankle a few weeks ago and she did use ice to help, she notes to still having swelling on to local area of spraining. There is no pain but does have soreness. She denies significant back pain or any other abnormal pain.    MEDICAL HISTORY:  Past Medical History:  Diagnosis Date  . Anemia associated with acute blood loss    11-28-2016 symptomatic  HG 6.2  transfused x1 11-29-2016  . H/O varicella   . Headache   . History of bacterial infection   . History of gestational diabetes mellitus, not pregnant   . History of gestational hypertension   . History of trichomoniasis   Hypertension Gestational diabetes Eczema Allergic rhinitis  menorrhagia related to dysfunctional uterine bleeding and fibroids  Chronic iron deficiency  Vitamin D deficiency  Chronic constipation on Linzess.  SURGICAL HISTORY: Past Surgical History:  Procedure Laterality Date  . CESAREAN SECTION  06/23/2001  . CESAREAN SECTION W/BTL  08/25/2007  . DILITATION & CURRETTAGE/HYSTROSCOPY WITH HYDROTHERMAL ABLATION N/A 12/30/2016   Procedure: DILATATION & CURETTAGE/HYSTEROSCOPY WITH HYDROTHERMAL  ABLATION;  Surgeon: Eldred Manges, MD;  Location: [facility];  Service: Gynecology;  Laterality: N/A;  . WISDOM TOOTH EXTRACTION      SOCIAL HISTORY: Social History   Social History  . Marital status: Married    Spouse name: N/A  . Number of children: N/A  . Years of education: N/A   Occupational  History  . Not on file.   Social History Main Topics  . Smoking status: Never Smoker  . Smokeless tobacco: Never Used  . Alcohol use Yes  . Drug use: No  . Sexual activity: Yes    Partners: Male    Birth control/ protection: Surgical     Comment: tubal ligation   Other Topics Concern  . Not on file   Social History Narrative  . No narrative on file    FAMILY HISTORY: Family History  Problem Relation Age of Onset  . Cancer Paternal Grandfather   . Hypertension Paternal Grandfather   . Hypertension Paternal Grandmother   . Hypertension Maternal Grandmother   . Heart attack Maternal Grandfather   . Hypertension Father   . Diabetes Mother     ALLERGIES:  is allergic to floxin [ofloxacin].  MEDICATIONS:  Current Outpatient Prescriptions  Medication Sig Dispense Refill  . b complex vitamins capsule Take 1 capsule by mouth daily. 30 capsule 3  . clobetasol cream (TEMOVATE) 0.34 % Apply 1 application topically daily as needed (eczema).     . ergocalciferol (VITAMIN D2) 50000 units capsule Take 1 capsule (50,000 Units total) by mouth once a week. 12 capsule 0  . EUCRISA 2 % OINT Apply 1 application topically daily as needed (eczema).   0  . ibuprofen (ADVIL,MOTRIN) 600 MG tablet Ibuprofen 600 mg by mouth every 6 hours for 3 days, then every 6 hours as needed for pain 60 tablet 2   No current facility-administered medications for this visit.     REVIEW OF SYSTEMS:    10 Point review of Systems was done is negative except as noted above.  PHYSICAL EXAMINATION: ECOG PERFORMANCE STATUS: 1 - Symptomatic but completely ambulatory  . Vitals:   10/06/17 1051  BP: (!) 148/96  Pulse: 77  Resp: 20  Temp: 98.1 F (36.7 C)  SpO2: 100%   Filed Weights   10/06/17 1051  Weight: 213 lb 4.8 oz (96.8 kg)   .Body mass index is 33.41 kg/m.  GENERAL:alert, in no acute distress and comfortable SKIN: skin color, texture, turgor are normal, no rashes or significant lesions EYES:  normal, conjunctiva are pink and non-injected, sclera clear OROPHARYNX:no exudate, no erythema and lips, buccal mucosa, and tongue normal  NECK: supple, no JVD, thyroid normal size, non-tender, without nodularity LYMPH:  no palpable lymphadenopathy in the cervical, axillary or inguinal LUNGS: clear to auscultation with normal respiratory effort HEART: regular rate & rhythm,  no murmurs and no lower extremity edema ABDOMEN: abdomen soft, non-tender, normoactive bowel sounds  no palpable hepatosplenomegaly  Musculoskeletal: no tenderness to palpation over the spine PSYCH: alert & oriented x 3 with fluent speech NEURO: no focal motor/sensory deficits  LABORATORY DATA:  I have reviewed the data as listed  . CBC Latest Ref Rng & Units 09/29/2017 06/06/2017 02/27/2017  WBC 3.9 - 10.3 10e3/uL 9.0 7.5 7.1  Hemoglobin 11.6 - 15.9 g/dL 14.2 13.1 12.8  Hematocrit 34.8 - 46.6 % 42.0 39.4 39.4  Platelets 145 - 400 10e3/uL 363 338 339   . CBC    Component Value Date/Time   WBC  9.0 09/29/2017 1629   WBC 6.6 02/20/2017 0718   RBC 4.40 09/29/2017 1629   RBC 4.59 02/20/2017 0718   HGB 14.2 09/29/2017 1629   HCT 42.0 09/29/2017 1629   PLT 363 09/29/2017 1629   MCV 95.5 09/29/2017 1629   MCH 32.3 09/29/2017 1629   MCH 27.5 02/20/2017 0718   MCHC 33.8 09/29/2017 1629   MCHC 32.7 02/20/2017 0718   RDW 13.0 09/29/2017 1629   LYMPHSABS 2.9 09/29/2017 1629   MONOABS 0.5 09/29/2017 1629   EOSABS 0.2 09/29/2017 1629   BASOSABS 0.1 09/29/2017 1629    . CMP Latest Ref Rng & Units 09/29/2017 09/29/2017 06/06/2017  Glucose 70 - 140 mg/dl 90 - 98  BUN 7.0 - 26.0 mg/dL 9.7 - 6.7(L)  Creatinine 0.6 - 1.1 mg/dL 0.8 - 0.7  Sodium 136 - 145 mEq/L 138 - 139  Potassium 3.5 - 5.1 mEq/L 3.6 - 3.5  Chloride 101 - 111 mmol/L - - -  CO2 22 - 29 mEq/L 27 - 26  Calcium 8.4 - 10.4 mg/dL 9.8 - 9.7  Total Protein 6.0 - 8.5 g/dL 7.7 8.2 8.0  Total Bilirubin 0.20 - 1.20 mg/dL 0.33 - 0.42  Alkaline Phos 40 - 150  U/L 56 - 48  AST 5 - 34 U/L 20 - 21  ALT 0 - 55 U/L 19 - 26    Lab Results  Component Value Date   FERRITIN 196 09/29/2017   Component     Latest Ref Rng & Units 01/23/2017  Folate, Hemolysate     Not Estab. ng/mL 456.7  HCT     34.0 - 46.6 % 35.3  Folate, RBC     >498 ng/mL 1,294  Vitamin B12     232 - 1,245 pg/mL 343  Folate     >3.0 ng/mL 18.3  Sed Rate     0 - 32 mm/hr 31  LDH     125 - 245 U/L 145  Beta 2     0.6 - 2.4 mg/L 1.3              RADIOGRAPHIC STUDIES: I have personally reviewed the radiological images as listed and agreed with the findings in the report.  Whole body skeletal survey    ASSESSMENT & PLAN:   46 year old female with   #1 s/p Severe microcytic anemia due to iron deficiency.  Anemia has resolved with IV Iron - Hgb WLN at 14.2. #2 severe iron deficiency likely related to severe menorrhagia from dysfunctional uterine bleeding and uterine fibroids.  Patient had previously dropped her hemoglobin to 6.1 requiring PRBC transfusions . She notes that her menorrhagia has significantly improved after her D&C and endometrial ablation on 12/30/2016. Patient notes she is recently had a colonoscopy which was negative. No overt evidence of GI bleeding or other blood loss  #3 poor tolerance to oral iron due to significant constipation. This is complicating her chronic constipation that has required treatment with linzess Plan -Patient's anemia has completely resolved with IV iron replacement and at this time does not seem to be related to her plasma cell neoplasm. -Since her D&C in 12/2016 her menorrhagia has resolved.  -No need for additional IV iron at this time. -She will continue with an iron rich diet and will start iron polysaccharide supplement, 150 mg daily.   #4 IgG lambda smoldering myeloma  Patient has an M spike of 1.9 g/dL (01/23/17) which has now decreased a little to 1.6g/dl Elevated lambda free light chains with  a significantly  abnormal kappa Lambda ratio UPEP with primary care physician was negative for monoclonal protein. No hypercalcemia and no renal insufficiency no bone pains. Whole body skeletal survey shows no overt lytic lesions in the visualized skeleton. Anemia does not appear to be related to her paraproteinemia currently. Bone marrow examination showed 10% lambda restricted plasma cells consistent with plasma cell neoplasm. Plan -No clinical or lab indication of progression of the patient's smoldering multiple myeloma at this time.  -Her levels are overall stable.  -We shall plan to see her back in 6 months with repeat labs unless any new concerns arise.  -#5 Vit D deficiency -started on Ergocalciferol 50k units weekly x 12 doses -continue f/u with PCP   Start 150 mg iron polysaccharide po daily  Return to care with Dr. Irene Limbo in 6 months with repeat labs one week before.    All of the patients questions were answered with apparent satisfaction. The patient knows to call the clinic with any problems, questions or concerns.  I spent 20 minutes counseling the patient face to face. The total time spent in the appointment was 25 minutes and more than 50% was on counseling and direct patient cares.    Sullivan Lone MD [place] AAHIVMS [facility] Hematology/Oncology Physician [place]  (Office):       [phone] (Work cell):  [phone] (Fax):           [phone]  This document serves as a record of services personally performed by Sullivan Lone, MD. It was created on her behalf by Joslyn Devon, a trained medical scribe. The creation of this record is based on the scribe's personal observations and the provider's statements to them. This document has been checked and approved by the attending provider.

## 2017-10-04 LAB — MULTIPLE MYELOMA PANEL, SERUM
ALBUMIN SERPL ELPH-MCNC: 3.7 g/dL (ref 2.9–4.4)
Albumin/Glob SerPl: 1 (ref 0.7–1.7)
Alpha 1: 0.2 g/dL (ref 0.0–0.4)
Alpha2 Glob SerPl Elph-Mcnc: 0.6 g/dL (ref 0.4–1.0)
B-GLOBULIN SERPL ELPH-MCNC: 1.2 g/dL (ref 0.7–1.3)
GAMMA GLOB SERPL ELPH-MCNC: 2 g/dL — AB (ref 0.4–1.8)
GLOBULIN, TOTAL: 4 g/dL — AB (ref 2.2–3.9)
IGA/IMMUNOGLOBULIN A, SERUM: 120 mg/dL (ref 87–352)
IgG, Qn, Serum: 1976 mg/dL — ABNORMAL HIGH (ref 700–1600)
IgM, Qn, Serum: 45 mg/dL (ref 26–217)
M PROTEIN SERPL ELPH-MCNC: 1.6 g/dL — AB
Total Protein: 7.7 g/dL (ref 6.0–8.5)

## 2017-10-06 ENCOUNTER — Encounter: Payer: Self-pay | Admitting: Hematology

## 2017-10-06 ENCOUNTER — Ambulatory Visit (HOSPITAL_BASED_OUTPATIENT_CLINIC_OR_DEPARTMENT_OTHER): Payer: 59 | Admitting: Hematology

## 2017-10-06 ENCOUNTER — Telehealth: Payer: Self-pay | Admitting: Hematology

## 2017-10-06 VITALS — BP 148/96 | HR 77 | Temp 98.1°F | Resp 20 | Ht 67.0 in | Wt 213.3 lb

## 2017-10-06 DIAGNOSIS — C9000 Multiple myeloma not having achieved remission: Secondary | ICD-10-CM

## 2017-10-06 DIAGNOSIS — D509 Iron deficiency anemia, unspecified: Secondary | ICD-10-CM | POA: Diagnosis not present

## 2017-10-06 DIAGNOSIS — D472 Monoclonal gammopathy: Secondary | ICD-10-CM

## 2017-10-06 DIAGNOSIS — D500 Iron deficiency anemia secondary to blood loss (chronic): Secondary | ICD-10-CM

## 2017-10-06 NOTE — Telephone Encounter (Signed)
Gave avs and calendar for April 2019 °

## 2017-10-06 NOTE — Patient Instructions (Signed)
To start 150 mg of iron Polysaccharide po daily over-the-counter.

## 2017-12-01 DIAGNOSIS — B370 Candidal stomatitis: Secondary | ICD-10-CM | POA: Diagnosis not present

## 2017-12-01 DIAGNOSIS — J029 Acute pharyngitis, unspecified: Secondary | ICD-10-CM | POA: Diagnosis not present

## 2018-02-05 DIAGNOSIS — G44201 Tension-type headache, unspecified, intractable: Secondary | ICD-10-CM | POA: Diagnosis not present

## 2018-02-05 DIAGNOSIS — M9901 Segmental and somatic dysfunction of cervical region: Secondary | ICD-10-CM | POA: Diagnosis not present

## 2018-02-05 DIAGNOSIS — M542 Cervicalgia: Secondary | ICD-10-CM | POA: Diagnosis not present

## 2018-02-17 IMAGING — DX DG BONE SURVEY MET
10 series · 10 of 10 positions shown · non-contrast
Comparison: None.

CLINICAL DATA: Iron deficient anemia.  Monoclonal paraproteinemia.

EXAM:
METASTATIC BONE SURVEY

[skull lat]
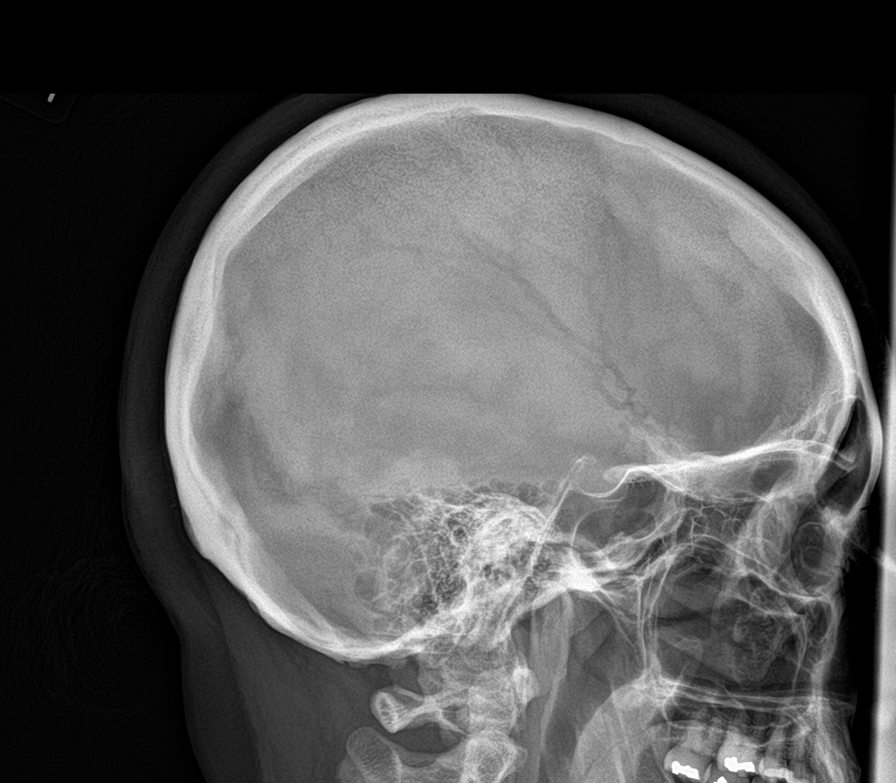

[shoulder ap (1 of 2)]
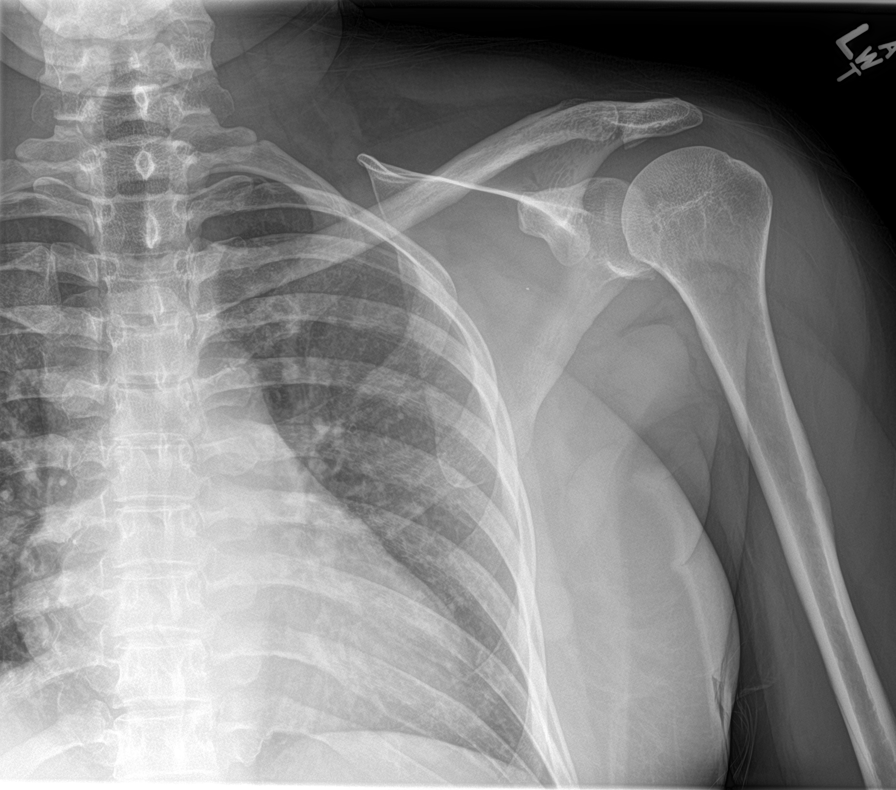

[shoulder ap (2 of 2)]
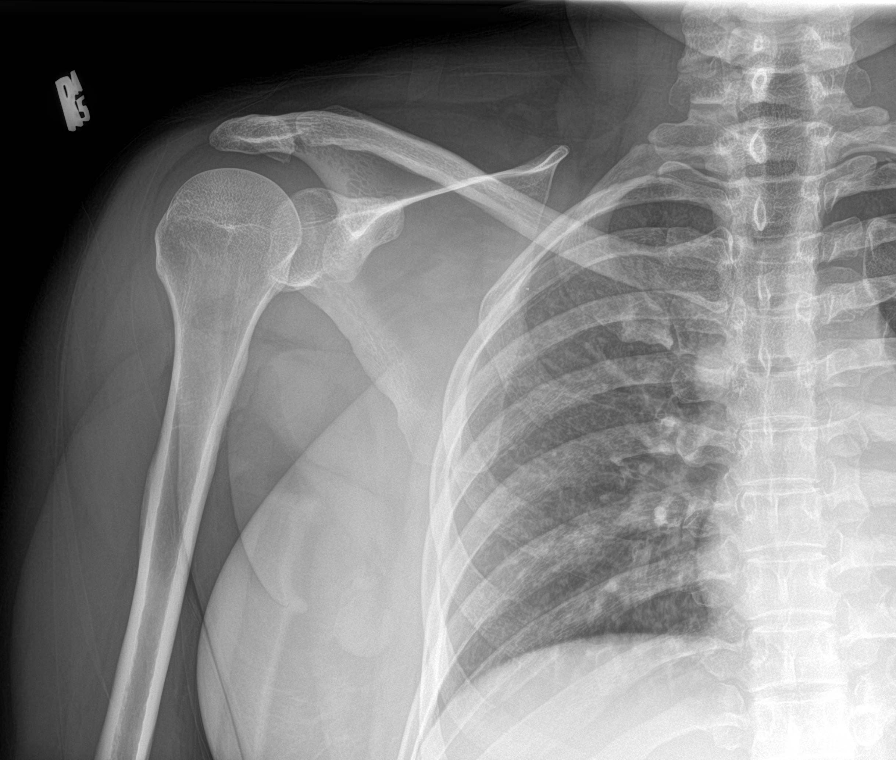

[humerus ap (1 of 2)]
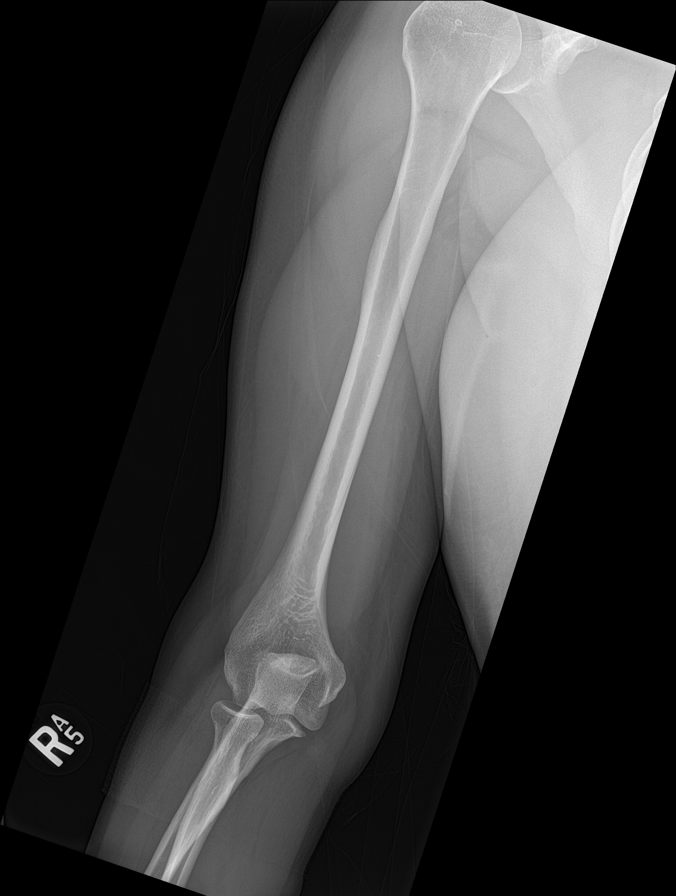

[humerus ap (2 of 2)]
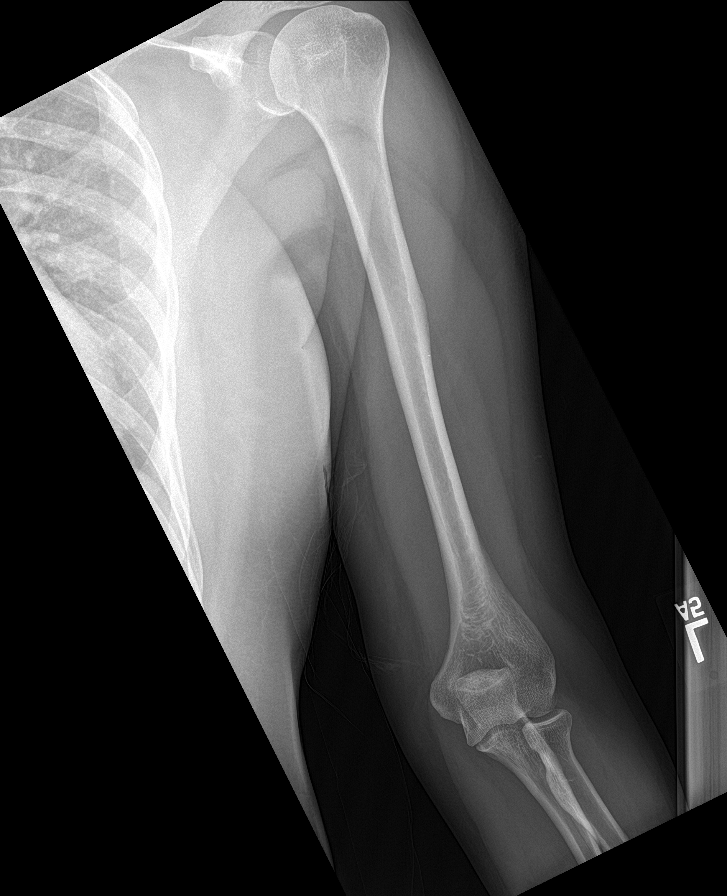

[forearm ap (1 of 2)]
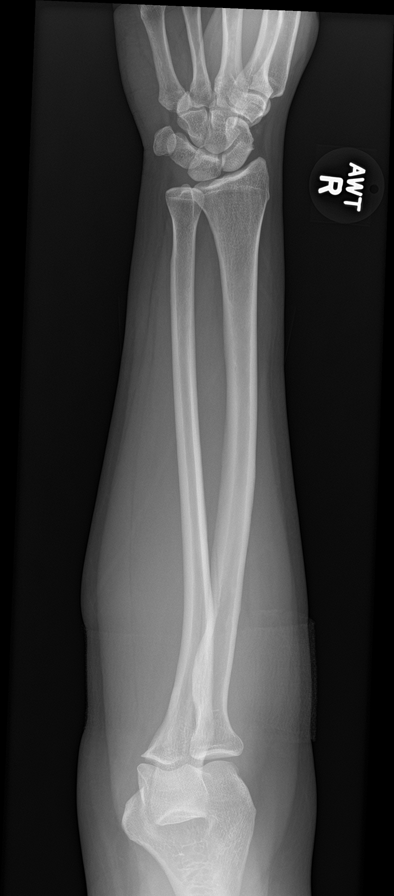

[forearm ap (2 of 2)]
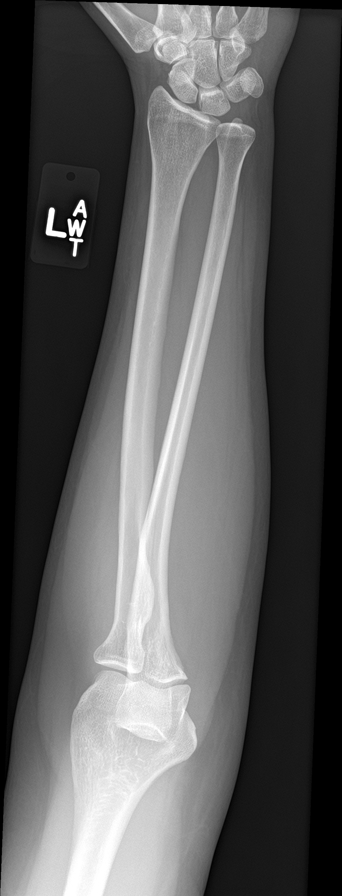

[c-spine ap]
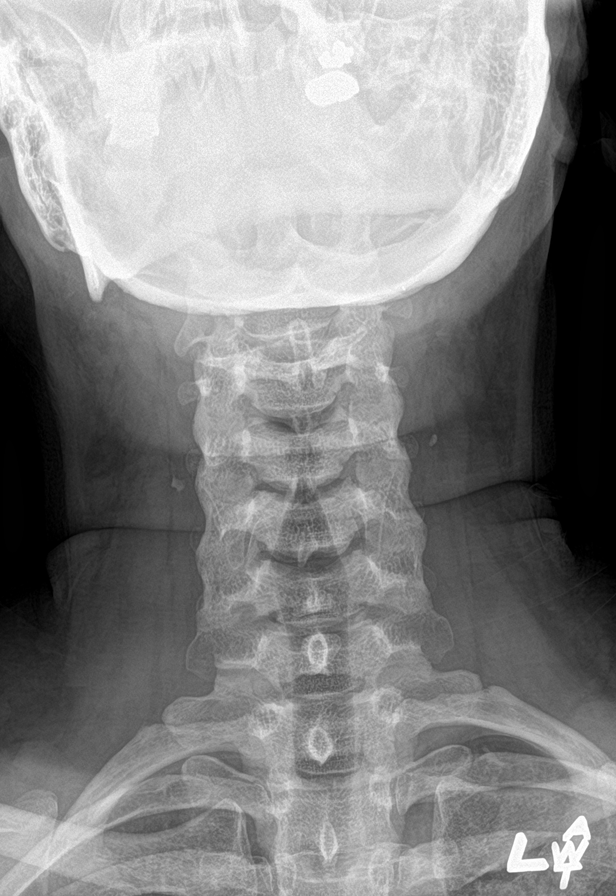

[c-spine lat]
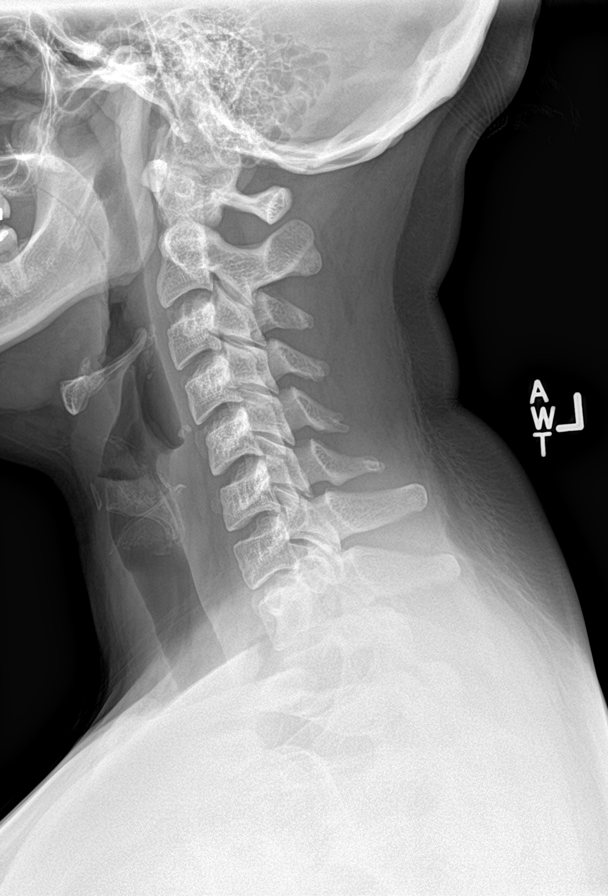

[t-spine ap]
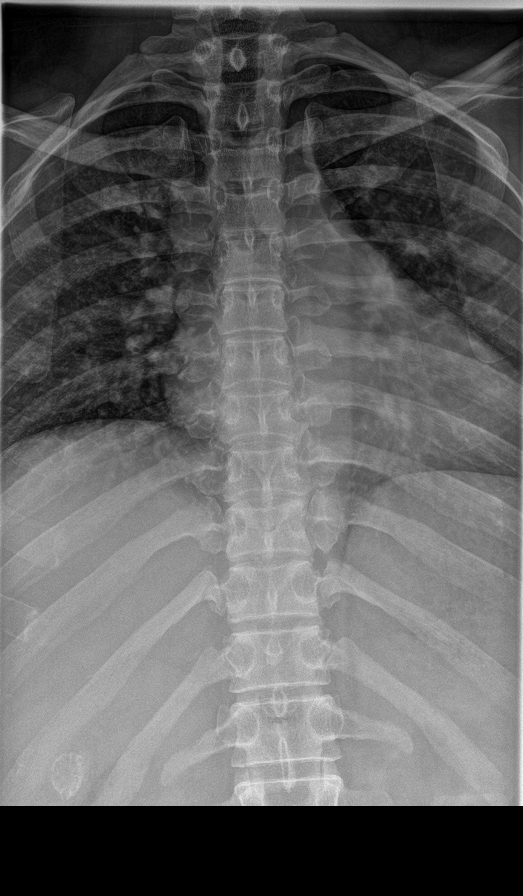

[10 of 10 positions shown; findings below may reference images not displayed]

FINDINGS: Old healed fracture is seen involving the right tibial shaft. No
lytic destruction or other abnormality is seen in the visualized
skeleton, including the spine, ribs, pelvis, extremities in skull
IMPRESSION: No evidence of lytic destruction seen in the visualized skeleton.

## 2018-03-30 ENCOUNTER — Other Ambulatory Visit: Payer: 59

## 2018-04-06 ENCOUNTER — Ambulatory Visit: Payer: 59 | Admitting: Hematology

## 2018-04-19 ENCOUNTER — Inpatient Hospital Stay: Payer: 59 | Admitting: Hematology

## 2018-04-20 ENCOUNTER — Telehealth: Payer: Self-pay

## 2018-04-20 NOTE — Telephone Encounter (Signed)
Pt called to cancel appt on 04/19/18. Requested to reschedule. Called pt to let her know that scheduling would get in touch to reschedule. Pt verbalized understanding. Scheduling message sent to reschedule lab and doctor visit per pt request.

## 2018-05-15 ENCOUNTER — Inpatient Hospital Stay: Payer: 59 | Attending: Hematology | Admitting: Hematology

## 2018-05-15 ENCOUNTER — Inpatient Hospital Stay: Payer: 59

## 2018-05-15 ENCOUNTER — Encounter: Payer: Self-pay | Admitting: Hematology

## 2018-05-15 ENCOUNTER — Telehealth: Payer: Self-pay

## 2018-05-15 VITALS — BP 145/72 | HR 81 | Temp 98.3°F | Resp 18 | Ht 67.0 in | Wt 217.4 lb

## 2018-05-15 DIAGNOSIS — K5909 Other constipation: Secondary | ICD-10-CM | POA: Diagnosis not present

## 2018-05-15 DIAGNOSIS — D259 Leiomyoma of uterus, unspecified: Secondary | ICD-10-CM | POA: Diagnosis not present

## 2018-05-15 DIAGNOSIS — I10 Essential (primary) hypertension: Secondary | ICD-10-CM

## 2018-05-15 DIAGNOSIS — R5383 Other fatigue: Secondary | ICD-10-CM

## 2018-05-15 DIAGNOSIS — N920 Excessive and frequent menstruation with regular cycle: Secondary | ICD-10-CM

## 2018-05-15 DIAGNOSIS — C9000 Multiple myeloma not having achieved remission: Secondary | ICD-10-CM

## 2018-05-15 DIAGNOSIS — D500 Iron deficiency anemia secondary to blood loss (chronic): Secondary | ICD-10-CM | POA: Insufficient documentation

## 2018-05-15 DIAGNOSIS — E559 Vitamin D deficiency, unspecified: Secondary | ICD-10-CM

## 2018-05-15 DIAGNOSIS — K909 Intestinal malabsorption, unspecified: Secondary | ICD-10-CM

## 2018-05-15 DIAGNOSIS — Z79899 Other long term (current) drug therapy: Secondary | ICD-10-CM | POA: Diagnosis not present

## 2018-05-15 DIAGNOSIS — D472 Monoclonal gammopathy: Secondary | ICD-10-CM

## 2018-05-15 LAB — CBC WITH DIFFERENTIAL (CANCER CENTER ONLY)
Basophils Absolute: 0 10*3/uL (ref 0.0–0.1)
Basophils Relative: 0 %
Eosinophils Absolute: 0.2 10*3/uL (ref 0.0–0.5)
Eosinophils Relative: 2 %
HEMATOCRIT: 40.8 % (ref 34.8–46.6)
HEMOGLOBIN: 13.5 g/dL (ref 11.6–15.9)
LYMPHS ABS: 2.6 10*3/uL (ref 0.9–3.3)
Lymphocytes Relative: 29 %
MCH: 31.3 pg (ref 25.1–34.0)
MCHC: 33.1 g/dL (ref 31.5–36.0)
MCV: 94.7 fL (ref 79.5–101.0)
MONOS PCT: 6 %
Monocytes Absolute: 0.5 10*3/uL (ref 0.1–0.9)
NEUTROS ABS: 5.8 10*3/uL (ref 1.5–6.5)
NEUTROS PCT: 63 %
Platelet Count: 379 10*3/uL (ref 145–400)
RBC: 4.31 MIL/uL (ref 3.70–5.45)
RDW: 13.8 % (ref 11.2–14.5)
WBC: 9.2 10*3/uL (ref 3.9–10.3)

## 2018-05-15 LAB — COMPREHENSIVE METABOLIC PANEL
ALBUMIN: 3.8 g/dL (ref 3.5–5.0)
ALT: 24 U/L (ref 0–55)
AST: 23 U/L (ref 5–34)
Alkaline Phosphatase: 53 U/L (ref 40–150)
Anion gap: 8 (ref 3–11)
BILIRUBIN TOTAL: 0.2 mg/dL (ref 0.2–1.2)
BUN: 9 mg/dL (ref 7–26)
CO2: 22 mmol/L (ref 22–29)
CREATININE: 0.79 mg/dL (ref 0.60–1.10)
Calcium: 9.1 mg/dL (ref 8.4–10.4)
Chloride: 106 mmol/L (ref 98–109)
GFR calc Af Amer: >60 mL/min (ref >60)
GLUCOSE: 126 mg/dL (ref 70–140)
POTASSIUM: 3.6 mmol/L (ref 3.5–5.1)
Sodium: 136 mmol/L (ref 136–145)
TOTAL PROTEIN: 7.8 g/dL (ref 6.4–8.3)

## 2018-05-15 LAB — RETICULOCYTES
RBC.: 4.31 MIL/uL (ref 3.70–5.45)
Retic Count, Absolute: 64.7 10*3/uL (ref 33.7–90.7)
Retic Ct Pct: 1.5 % (ref 0.7–2.1)

## 2018-05-15 NOTE — Telephone Encounter (Signed)
Printed avs and calender of upcoming appointment per 5/28 los 

## 2018-05-15 NOTE — Progress Notes (Signed)
[name]    HEMATOLOGY/ONCOLOGY CLINIC NOTE  Date of Service: 05/15/2018   Patient Care Team: [name], MD as PCP - General (Internal Medicine)  CHIEF COMPLAINTS/PURPOSE OF CONSULTATION:  Iron deficiency Anemia and  Smoldering Myeloma  HISTORY OF PRESENTING ILLNESS:   Shirley Griffin is a wonderful 47 y.o. female who has been referred to [place] by Dr .[name], MD for evaluation and management of Anemia and M spike.  Patient was admitted to the GYN in mid January 2018 for severe menorrhagia thought to be related to dysfunctional uterine bleeding and fibroids. Her hemoglobin was down to 6.1 with an MCV of 61 normal WBC count of 9.3k elevated platelets of 606k.  Patient received PRBC transfusions For her severe symptomatic anemia. Posttransfusion hemoglobin was 9.5. She has also been started on oral iron and has been taking ferrous sulfate 1 tablet by mouth twice a day with vitamin C. This has bothered her with significant constipation since she has also had chronic constipation for which she is on Linzess.   Patient had a D&C and endometrial ablation to control her severe menorrhagia on 12/30/2016   patient had additional anemia workup with her primary care physician which included SPEP which showed an M spike of 1.8 g/dL. UPEP showed no M spike. Folic acid within normal limits. Celiac disease panel was done which was unrevealing.  Given persistent severe iron deficiency despite oral iron replacement, borderline tolerance and monoclonal paraproteinemia the patient was referred to [place] for further evaluation and management.   Patient notes no focal bone pains. Her labs with her primary care physician showed no renal insufficiency or hypercalcemia. TSH was within normal limits.  Patient notes no overt GI bleeding or other blood loss other than severe menorrhagia.patient notes that she's had colonoscopy In the last year which was noted to be negative. Patient notes that her menorrhagia has reduced  after her D&C and endometrial ablation.  INTERVAL HISTORY   [name] is here for a scheduled follow-up of her iron deficiency anemia and smoldering myeloma. She has been doing well, overall. She reports being more fatigued lately and adds she has not been taking her vitamins lately. She continues to have her menstrual cycle but it has been really light since her endometrial ablation January 2018. Her weight has been steadily going up. She denies any pain or extremity edema. Otherwise, she denies any other complaints at this time.  Most recent lab results (05/15/18) of CBC and CMP as follows: all labs are WNL.  On review of systems, pt denies fever, chills, weight loss, decreased appetite, decreased energy levels, mouth sores and bilateral lower extremity edema. Denies pain. Pt denies abdominal pain, nausea, vomiting and bladder and bowel changes.   MEDICAL HISTORY:  Past Medical History:  Diagnosis Date  . Anemia associated with acute blood loss    11-28-2016 symptomatic  HG 6.2  transfused x1 11-29-2016  . H/O varicella   . Headache   . History of bacterial infection   . History of gestational diabetes mellitus, not pregnant   . History of gestational hypertension   . History of trichomoniasis   Hypertension Gestational diabetes Eczema Allergic rhinitis  menorrhagia related to dysfunctional uterine bleeding and fibroids  Chronic iron deficiency  Vitamin D deficiency  Chronic constipation on Linzess.  SURGICAL HISTORY: Past Surgical History:  Procedure Laterality Date  . CESAREAN SECTION  06/23/2001  . CESAREAN SECTION W/BTL  08/25/2007  . DILITATION & CURRETTAGE/HYSTROSCOPY WITH HYDROTHERMAL ABLATION N/A 12/30/2016   Procedure:  DILATATION & CURETTAGE/HYSTEROSCOPY WITH HYDROTHERMAL ABLATION;  Surgeon: Eldred Manges, MD;  Location: [facility];  Service: Gynecology;  Laterality: N/A;  . WISDOM TOOTH EXTRACTION      SOCIAL HISTORY: Social History   Socioeconomic History  .  Marital status: Married    Spouse name: Not on file  . Number of children: Not on file  . Years of education: Not on file  . Highest education level: Not on file  Occupational History  . Not on file  Social Needs  . Financial resource strain: Not on file  . Food insecurity:    Worry: Not on file    Inability: Not on file  . Transportation needs:    Medical: Not on file    Non-medical: Not on file  Tobacco Use  . Smoking status: Never Smoker  . Smokeless tobacco: Never Used  Substance and Sexual Activity  . Alcohol use: Yes  . Drug use: No  . Sexual activity: Yes    Partners: Male    Birth control/protection: Surgical    Comment: tubal ligation  Lifestyle  . Physical activity:    Days per week: Not on file    Minutes per session: Not on file  . Stress: Not on file  Relationships  . Social connections:    Talks on phone: Not on file    Gets together: Not on file    Attends religious service: Not on file    Active member of club or organization: Not on file    Attends meetings of clubs or organizations: Not on file    Relationship status: Not on file  . Intimate partner violence:    Fear of current or ex partner: Not on file    Emotionally abused: Not on file    Physically abused: Not on file    Forced sexual activity: Not on file  Other Topics Concern  . Not on file  Social History Narrative  . Not on file    FAMILY HISTORY: Family History  Problem Relation Age of Onset  . Cancer Paternal Grandfather   . Hypertension Paternal Grandfather   . Hypertension Paternal Grandmother   . Hypertension Maternal Grandmother   . Heart attack Maternal Grandfather   . Hypertension Father   . Diabetes Mother     ALLERGIES:  is allergic to floxin [ofloxacin].  MEDICATIONS:  Current Outpatient Medications  Medication Sig Dispense Refill  . b complex vitamins capsule Take 1 capsule by mouth daily. 30 capsule 3  . clobetasol cream (TEMOVATE) 2.94 % Apply 1 application  topically daily as needed (eczema).     . ergocalciferol (VITAMIN D2) 50000 units capsule Take 1 capsule (50,000 Units total) by mouth once a week. 12 capsule 0  . EUCRISA 2 % OINT Apply 1 application topically daily as needed (eczema).   0  . ibuprofen (ADVIL,MOTRIN) 600 MG tablet Ibuprofen 600 mg by mouth every 6 hours for 3 days, then every 6 hours as needed for pain 60 tablet 2   No current facility-administered medications for this visit.     REVIEW OF SYSTEMS:   .10 Point review of Systems was done is negative except as noted above.   PHYSICAL EXAMINATION: ECOG PERFORMANCE STATUS: 1 - Symptomatic but completely ambulatory  . Vitals:   05/15/18 1527  BP: (!) 145/72  Pulse: 81  Resp: 18  Temp: 98.3 F (36.8 C)  SpO2: 100%   Filed Weights   05/15/18 1527  Weight: 217 lb 6.4 oz (  98.6 kg)   .Body mass index is 34.05 kg/m.  [name] GENERAL:alert, in no acute distress and comfortable SKIN: no acute rashes, no significant lesions EYES: conjunctiva are pink and non-injected, sclera anicteric OROPHARYNX: MMM, no exudates, no oropharyngeal erythema or ulceration NECK: supple, no JVD LYMPH:  no palpable lymphadenopathy in the cervical, axillary or inguinal regions LUNGS: clear to auscultation b/l with normal respiratory effort HEART: regular rate & rhythm ABDOMEN:  normoactive bowel sounds , non tender, not distended. Extremity: no pedal edema PSYCH: alert & oriented x 3 with fluent speech NEURO: no focal motor/sensory deficits   LABORATORY DATA:  I have reviewed the data as listed  . CBC Latest Ref Rng & Units 05/15/2018 09/29/2017 06/06/2017  WBC 3.9 - 10.3 K/uL 9.2 9.0 7.5  Hemoglobin 11.6 - 15.9 g/dL 13.5 14.2 13.1  Hematocrit 34.8 - 46.6 % 40.8 42.0 39.4  Platelets 145 - 400 K/uL 379 363 338   . CBC    Component Value Date/Time   WBC 9.2 05/15/2018 1444   WBC 9.0 09/29/2017 1629   WBC 6.6 02/20/2017 0718   RBC 4.31 05/15/2018 1444   RBC 4.31 05/15/2018 1444    HGB 13.5 05/15/2018 1444   HGB 14.2 09/29/2017 1629   HCT 40.8 05/15/2018 1444   HCT 42.0 09/29/2017 1629   PLT 379 05/15/2018 1444   PLT 363 09/29/2017 1629   MCV 94.7 05/15/2018 1444   MCV 95.5 09/29/2017 1629   MCH 31.3 05/15/2018 1444   MCHC 33.1 05/15/2018 1444   RDW 13.8 05/15/2018 1444   RDW 13.0 09/29/2017 1629   LYMPHSABS 2.6 05/15/2018 1444   LYMPHSABS 2.9 09/29/2017 1629   MONOABS 0.5 05/15/2018 1444   MONOABS 0.5 09/29/2017 1629   EOSABS 0.2 05/15/2018 1444   EOSABS 0.2 09/29/2017 1629   BASOSABS 0.0 05/15/2018 1444   BASOSABS 0.1 09/29/2017 1629    . CMP Latest Ref Rng & Units 09/29/2017 09/29/2017 06/06/2017  Glucose 70 - 140 mg/dl 90 - 98  BUN 7.0 - 26.0 mg/dL 9.7 - 6.7(L)  Creatinine 0.6 - 1.1 mg/dL 0.8 - 0.7  Sodium 136 - 145 mEq/L 138 - 139  Potassium 3.5 - 5.1 mEq/L 3.6 - 3.5  Chloride 101 - 111 mmol/L - - -  CO2 22 - 29 mEq/L 27 - 26  Calcium 8.4 - 10.4 mg/dL 9.8 - 9.7  Total Protein 6.0 - 8.5 g/dL 7.7 8.2 8.0  Total Bilirubin 0.20 - 1.20 mg/dL 0.33 - 0.42  Alkaline Phos 40 - 150 U/L 56 - 48  AST 5 - 34 U/L 20 - 21  ALT 0 - 55 U/L 19 - 26    Lab Results  Component Value Date   FERRITIN 138 05/15/2018                 RADIOGRAPHIC STUDIES: I have personally reviewed the radiological images as listed and agreed with the findings in the report.  No results found.   ASSESSMENT & PLAN:   47 year old female with   #1 s/p Severe microcytic anemia due to iron deficiency.  Anemia has resolved with IV Iron - Hgb WLN at 13.5 #2 severe iron deficiency likely related to severe menorrhagia from dysfunctional uterine bleeding and uterine fibroids.  Patient had previously dropped her hemoglobin to 6.1 requiring PRBC transfusions . She notes that her menorrhagia has significantly improved after her D&C and endometrial ablation on 12/30/2016. Patient notes she is recently had a colonoscopy which was negative. No overt evidence of  GI bleeding or  other blood loss  #3 poor tolerance to oral iron due to significant constipation. This is complicating her chronic constipation that has required treatment with linzess Plan -Patient's anemia has completely resolved with IV iron replacement and at this time does not seem to be related to her plasma cell neoplasm. -No need for additional IV iron at this time.ferritin>100 -She will continue with an iron rich diet and will start iron polysaccharide supplement, 150 mg daily.   #4 IgG lambda smoldering myeloma  Patient has an M spike of 1.9 g/dL (01/23/17) which has now decreased a little to 1.6g/dl and today is down to 1.4g/dl Elevated lambda free light chains with a significantly abnormal kappa Lambda ratio UPEP with primary care physician was negative for monoclonal protein. No hypercalcemia and no renal insufficiency no bone pains. Whole body skeletal survey shows no overt lytic lesions in the visualized skeleton. Anemia does not appear to be related to her paraproteinemia currently. Bone marrow examination showed 10% lambda restricted plasma cells consistent with plasma cell neoplasm. Plan -No clinical or lab indication of progression of the patient's smoldering multiple myeloma at this time.  -Her llabs are stable with no new anemia/renal insuff or hypercalcema   RTC with Dr Irene Limbo in 6 months with labs   All of the patients questions were answered with apparent satisfaction. The patient knows to call the clinic with any problems, questions or concerns.  . The total time spent in the appointment was 20 minutes and more than 50% was on counseling and direct patient cares.     Sullivan Lone MD Brock AAHIVMS [facility] Hematology/Oncology Physician [place]  (Office):       [phone] (Work cell):  [phone] (Fax):           [phone]  This document serves as a record of services personally performed by Sullivan Lone, MD. It was created on his behalf by Margit Banda,  a trained medical scribe. The creation of this record is based on the scribe's personal observations and the provider's statements to them.   .I have reviewed the above documentation for accuracy and completeness, and I agree with the above. Brunetta Genera MD

## 2018-05-16 LAB — KAPPA/LAMBDA LIGHT CHAINS
KAPPA, LAMDA LIGHT CHAIN RATIO: 0.18 — AB (ref 0.26–1.65)
Kappa free light chain: 14.4 mg/L (ref 3.3–19.4)
LAMDA FREE LIGHT CHAINS: 79.3 mg/L — AB (ref 5.7–26.3)

## 2018-05-16 LAB — IRON AND TIBC
Iron: 57 ug/dL (ref 41–142)
Saturation Ratios: 17 % — ABNORMAL LOW (ref 21–57)
TIBC: 326 ug/dL (ref 236–444)
UIBC: 269 ug/dL

## 2018-05-16 LAB — FERRITIN: Ferritin: 138 ng/mL (ref 9–269)

## 2018-05-18 LAB — MULTIPLE MYELOMA PANEL, SERUM
ALBUMIN SERPL ELPH-MCNC: 3.6 g/dL (ref 2.9–4.4)
ALBUMIN/GLOB SERPL: 1 (ref 0.7–1.7)
ALPHA 1: 0.2 g/dL (ref 0.0–0.4)
Alpha2 Glob SerPl Elph-Mcnc: 0.6 g/dL (ref 0.4–1.0)
B-GLOBULIN SERPL ELPH-MCNC: 1.1 g/dL (ref 0.7–1.3)
GAMMA GLOB SERPL ELPH-MCNC: 1.8 g/dL (ref 0.4–1.8)
Globulin, Total: 3.7 g/dL (ref 2.2–3.9)
IgA: 118 mg/dL (ref 87–352)
IgG (Immunoglobin G), Serum: 1944 mg/dL — ABNORMAL HIGH (ref 700–1600)
IgM (Immunoglobulin M), Srm: 41 mg/dL (ref 26–217)
M PROTEIN SERPL ELPH-MCNC: 1.4 g/dL — AB
Total Protein ELP: 7.3 g/dL (ref 6.0–8.5)

## 2018-08-17 DIAGNOSIS — I10 Essential (primary) hypertension: Secondary | ICD-10-CM | POA: Diagnosis not present

## 2018-08-27 DIAGNOSIS — Z0000 Encounter for general adult medical examination without abnormal findings: Secondary | ICD-10-CM | POA: Diagnosis not present

## 2018-08-27 DIAGNOSIS — D472 Monoclonal gammopathy: Secondary | ICD-10-CM | POA: Diagnosis not present

## 2018-08-27 DIAGNOSIS — C9000 Multiple myeloma not having achieved remission: Secondary | ICD-10-CM | POA: Diagnosis not present

## 2018-11-13 ENCOUNTER — Inpatient Hospital Stay: Payer: 59 | Attending: Hematology | Admitting: Hematology

## 2018-11-13 ENCOUNTER — Inpatient Hospital Stay: Payer: 59

## 2023-04-06 ENCOUNTER — Other Ambulatory Visit (HOSPITAL_COMMUNITY): Payer: Self-pay

## 2023-04-06 ENCOUNTER — Encounter: Payer: Self-pay | Admitting: Hematology

## 2023-04-06 MED ORDER — TRULICITY 3 MG/0.5ML ~~LOC~~ SOAJ
3.0000 mg | SUBCUTANEOUS | 0 refills | Status: DC
  Filled 2023-04-06 – 2023-04-24 (×2): qty 2, 28d supply, fill #0
  Filled 2023-07-19: qty 2, 28d supply, fill #1
  Filled 2023-08-15: qty 2, 28d supply, fill #2

## 2023-04-14 ENCOUNTER — Other Ambulatory Visit (HOSPITAL_COMMUNITY): Payer: Self-pay

## 2023-04-24 ENCOUNTER — Other Ambulatory Visit (HOSPITAL_COMMUNITY): Payer: Self-pay

## 2023-07-19 ENCOUNTER — Other Ambulatory Visit (HOSPITAL_COMMUNITY): Payer: Self-pay

## 2023-08-17 ENCOUNTER — Other Ambulatory Visit (HOSPITAL_COMMUNITY): Payer: Self-pay

## 2023-09-27 ENCOUNTER — Other Ambulatory Visit (HOSPITAL_COMMUNITY): Payer: Self-pay

## 2023-09-27 MED ORDER — TRULICITY 3 MG/0.5ML ~~LOC~~ SOAJ
3.0000 mg | SUBCUTANEOUS | 0 refills | Status: AC
  Filled 2023-09-27: qty 6, 84d supply, fill #0

## 2023-09-28 ENCOUNTER — Other Ambulatory Visit (HOSPITAL_COMMUNITY): Payer: Self-pay
